# Patient Record
Sex: Female | Born: 1970 | Race: White | Hispanic: No | Marital: Married | Smoking: Current every day smoker
Health system: Southern US, Community
[De-identification: ages and names within clinical notes are randomized; demographics above are authoritative.]

## PROBLEM LIST (undated history)

## (undated) DIAGNOSIS — K219 Gastro-esophageal reflux disease without esophagitis: Secondary | ICD-10-CM

## (undated) DIAGNOSIS — K589 Irritable bowel syndrome without diarrhea: Secondary | ICD-10-CM

## (undated) DIAGNOSIS — E079 Disorder of thyroid, unspecified: Secondary | ICD-10-CM

## (undated) DIAGNOSIS — E785 Hyperlipidemia, unspecified: Secondary | ICD-10-CM

## (undated) DIAGNOSIS — C539 Malignant neoplasm of cervix uteri, unspecified: Secondary | ICD-10-CM

## (undated) HISTORY — DX: Gastro-esophageal reflux disease without esophagitis: K21.9

## (undated) HISTORY — DX: Disorder of thyroid, unspecified: E07.9

## (undated) HISTORY — PX: TONSILLECTOMY: SUR1361

## (undated) HISTORY — PX: CARPAL TUNNEL RELEASE: SHX101

## (undated) HISTORY — DX: Hyperlipidemia, unspecified: E78.5

## (undated) HISTORY — PX: ABDOMINAL HYSTERECTOMY: SHX81

## (undated) HISTORY — DX: Malignant neoplasm of cervix uteri, unspecified: C53.9

## (undated) HISTORY — DX: Irritable bowel syndrome, unspecified: K58.9

---

## 2003-04-22 ENCOUNTER — Other Ambulatory Visit: Admission: RE | Admit: 2003-04-22 | Discharge: 2003-04-22 | Payer: Self-pay | Admitting: Family Medicine

## 2005-03-01 ENCOUNTER — Other Ambulatory Visit: Admission: RE | Admit: 2005-03-01 | Discharge: 2005-03-01 | Payer: Self-pay | Admitting: Family Medicine

## 2012-02-20 HISTORY — PX: ESOPHAGOGASTRODUODENOSCOPY: SHX1529

## 2013-09-16 ENCOUNTER — Other Ambulatory Visit: Payer: Self-pay | Admitting: General Practice

## 2016-03-31 LAB — HEMOGLOBIN A1C: Hemoglobin A1C: 5.4

## 2016-03-31 LAB — TSH: TSH: 8.81 u[IU]/mL — AB (ref 0.41–5.90)

## 2016-04-04 LAB — LIPID PANEL
Cholesterol: 193 mg/dL (ref 0–200)
HDL: 23 mg/dL — AB (ref 35–70)
LDL Cholesterol: 135 mg/dL
Triglycerides: 115 mg/dL (ref 40–160)

## 2016-05-20 ENCOUNTER — Ambulatory Visit (INDEPENDENT_AMBULATORY_CARE_PROVIDER_SITE_OTHER): Payer: Self-pay | Admitting: "Endocrinology

## 2016-05-20 ENCOUNTER — Encounter: Payer: 59 | Admitting: "Endocrinology

## 2016-05-20 ENCOUNTER — Encounter: Payer: Self-pay | Admitting: "Endocrinology

## 2016-05-20 VITALS — BP 130/74 | HR 82 | Ht 62.0 in | Wt 160.0 lb

## 2016-05-20 DIAGNOSIS — E038 Other specified hypothyroidism: Secondary | ICD-10-CM | POA: Insufficient documentation

## 2016-05-20 MED ORDER — LEVOTHYROXINE SODIUM 25 MCG PO TABS: 25.0000 ug | ORAL_TABLET | Freq: Every day | ORAL | Status: DC

## 2016-05-20 NOTE — Progress Notes (Signed)
Subjective:    Patient ID: Brooke Parsons, female    DOB: Apr 11, 1971, PCP Sherrilyn Rist   Past Medical History  Diagnosis Date  . Irritable bowel syndrome (IBS)   . GERD (gastroesophageal reflux disease)   . Hyperlipidemia   . Cervical cancer [facility])    Past Surgical History  Procedure Laterality Date  . Abdominal hysterectomy    . Tonsillectomy    . Carpal tunnel release     Social History   Social History  . Marital Status: Married    Spouse Name: N/A  . Number of Children: N/A  . Years of Education: N/A   Social History Main Topics  . Smoking status: Current Every Day Smoker  . Smokeless tobacco: None  . Alcohol Use: No  . Drug Use: No  . Sexual Activity: Not Asked   Other Topics Concern  . None   Social History Narrative  . None   Outpatient Encounter Prescriptions as of 05/20/2016  Medication Sig  . HYDROcodone-acetaminophen (NORCO/VICODIN) 5-325 MG tablet Take 1 tablet by mouth every 6 (six) hours as needed for moderate pain.  . pantoprazole (PROTONIX) 40 MG tablet Take 40 mg by mouth daily.   No facility-administered encounter medications on file as of 05/20/2016.   ALLERGIES: Allergies  Allergen Reactions  . Clindamycin/Lincomycin   . Morphine And Related   . Synthroid [Levothyroxine] Palpitations    Palpitations, SOB   VACCINATION STATUS:  There is no immunization history on file for this patient.  HPI 45 year old female patient with medical history as above. She is being seen in consultation for hypothyroidism requested by her Candi Leash, PA-C. She was diagnosed with hypothyroidism in May 2016. She was started on levothyroxine 25 g by mouth every morning. She claims that she did not tolerate this medication causing palpitations for her. She has distant relatives with thyroid dysfunction including a cousin. She denies percentile history of goiter, family history of thyroid cancer. -She has dealt with fatigue, weight gain, cold  intolerance, and constipation for approximately 6 months. -She has 3 grown children, none of them diagnosed with thyroid problems. - Her thyroid function tests in May were significant for elevated TSH of 8.8, free T4 low at 0. 77. She also had dyslipidemia including high LDL of 135. Review of Systems  Constitutional: +weight gain, + fatigue, + subjective hypothermia Eyes: no blurry vision, no xerophthalmia ENT: no sore throat, no nodules palpated in throat, no dysphagia/odynophagia, no hoarseness Cardiovascular: no CP/SOB/palpitations/leg swelling Respiratory: no cough/SOB Gastrointestinal: no N/V/D/C Musculoskeletal: + muscle/joint aches Skin: no rashes Neurological: no tremors/numbness/tingling/dizziness Psychiatric: no depression/anxiety   Objective:    BP 130/74 mmHg  Pulse 82  Ht 5\' 2"  (1.575 m)  Wt 160 lb (72.576 kg)  BMI 29.26 kg/m2  SpO2 97%  Wt Readings from Last 3 Encounters:  05/20/16 160 lb (72.576 kg)    Physical Exam  Constitutional: overweight, in NAD Eyes: PERRLA, EOMI, no exophthalmos ENT: moist mucous membranes, no thyromegaly, no cervical lymphadenopathy Cardiovascular: RRR, No MRG Respiratory: CTA B Gastrointestinal: abdomen soft, NT, ND, BS+ Musculoskeletal: no deformities, strength intact in all 4, She just had carpal tunnel surgery on the right hand. Skin: moist, warm, no rashes Neurological: no tremor with outstretched hands, DTR normal in all 4    Assessment & Plan:   Hypothyroidism: -Etiology unclear. I will obtain antithyroid antibodies next visit. I had a long discussion with her regarding choices of replacing thyroid hormone. At this time she does not need  100% replacement. I have advised her to resume Synthroid (brand name) half of 25 g by mouth every morning. This dose needs to be adjusted up for his as she tolerates. -  - We discussed about correct intake of levothyroxine, at fasting, with water, separated by at least 30 minutes from  breakfast, and separated by more than 4 hours from calcium, iron, multivitamins, acid reflux medications (PPIs). -Patient is made aware of the fact that thyroid hormone replacement is needed for life, dose to be adjusted by periodic monitoring of thyroid function tests. -Her next test would be 7 weeks from now with office visit. -I have emphasized the fact that treatment of hypothyroidism will help with the cholesterol clearance as well.  - I advised patient to maintain close follow up with Candi Leash, PA-C for primary care needs. Follow up plan: 8 weeks with labs. Glade Lloyd, MD Phone: [phone]  Fax: [phone]   05/20/2016, 3:30 PM

## 2016-05-20 NOTE — Progress Notes (Signed)
  HPI Review of Systems Objective:   Physical Exam     This is a duplicate entry. Patient was seen and documented on separate entry.

## 2016-06-01 ENCOUNTER — Encounter: Payer: Self-pay | Admitting: "Endocrinology

## 2016-07-18 ENCOUNTER — Ambulatory Visit (INDEPENDENT_AMBULATORY_CARE_PROVIDER_SITE_OTHER): Payer: Self-pay | Admitting: "Endocrinology

## 2016-07-18 ENCOUNTER — Encounter: Payer: Self-pay | Admitting: "Endocrinology

## 2016-07-18 VITALS — BP 114/71 | HR 55 | Ht 62.0 in | Wt 160.0 lb

## 2016-07-18 DIAGNOSIS — E063 Autoimmune thyroiditis: Secondary | ICD-10-CM

## 2016-07-18 DIAGNOSIS — E038 Other specified hypothyroidism: Secondary | ICD-10-CM

## 2016-07-18 MED ORDER — LEVOTHYROXINE SODIUM 25 MCG PO TABS: 25.0000 ug | ORAL_TABLET | Freq: Every day | ORAL | 3 refills | Status: DC

## 2016-07-18 NOTE — Progress Notes (Signed)
Subjective:    Patient ID: Brooke Parsons, female    DOB: [birth year], PCP Sherrilyn Rist   Past Medical History:  Diagnosis Date  . Cervical cancer ([place])   . GERD (gastroesophageal reflux disease)   . Hyperlipidemia   . Irritable bowel syndrome (IBS)    Past Surgical History:  Procedure Laterality Date  . ABDOMINAL HYSTERECTOMY    . CARPAL TUNNEL RELEASE    . TONSILLECTOMY     Social History   Social History  . Marital status: Married    Spouse name: N/A  . Number of children: N/A  . Years of education: N/A   Social History Main Topics  . Smoking status: Current Every Day Smoker  . Smokeless tobacco: Never Used  . Alcohol use No  . Drug use: No  . Sexual activity: Not Asked   Other Topics Concern  . None   Social History Narrative  . None   Outpatient Encounter Prescriptions as of 07/18/2016  Medication Sig  . levothyroxine (SYNTHROID, LEVOTHROID) 25 MCG tablet Take 1 tablet (25 mcg total) by mouth daily before breakfast.  . pantoprazole (PROTONIX) 40 MG tablet Take 40 mg by mouth daily.  . [DISCONTINUED] HYDROcodone-acetaminophen (NORCO/VICODIN) 5-325 MG tablet Take 1 tablet by mouth every 6 (six) hours as needed for moderate pain.  . [DISCONTINUED] levothyroxine (SYNTHROID, LEVOTHROID) 25 MCG tablet Take 1 tablet (25 mcg total) by mouth daily before breakfast.   No facility-administered encounter medications on file as of 07/18/2016.    ALLERGIES: Allergies  Allergen Reactions  . Clindamycin/Lincomycin   . Morphine And Related   . Synthroid [Levothyroxine] Palpitations    Palpitations, SOB   VACCINATION STATUS:  There is no immunization history on file for this patient.  HPI 45 year old female patient with medical history as above. She is being seen in  Follow-up  for hypothyroidism requested by her Candi Leash, PA-C. She was diagnosed with hypothyroidism in May 2016. She was started on levothyroxine 12.5 g by mouth every morning. She  has tolerated at this dose since last visit . She claims that she did not tolerate this medication causing palpitations for her. She has distant relatives with thyroid dysfunction including a cousin. She denies percentile history of goiter, family history of thyroid cancer. -She has dealt with fatigue, weight gain, cold intolerance, and constipation for approximately 6 months. -She has 3 grown children, none of them diagnosed with thyroid problems.  Review of Systems  Constitutional:  Stable weight , + fatigue, + subjective hypothermia Eyes: no blurry vision, no xerophthalmia ENT: no sore throat, no nodules palpated in throat, no dysphagia/odynophagia, no hoarseness Cardiovascular: no CP/SOB/palpitations/leg swelling Respiratory: no cough/SOB Gastrointestinal: no N/V/D/C Musculoskeletal: + muscle/joint aches Skin: no rashes Neurological: no tremors/numbness/tingling/dizziness Psychiatric: no depression/anxiety   Objective:    BP 114/71   Pulse (!) 55   Ht 5\' 2"  (1.575 m)   Wt 160 lb (72.6 kg)   BMI 29.26 kg/m   Wt Readings from Last 3 Encounters:  07/18/16 160 lb (72.6 kg)  05/20/16 160 lb (72.6 kg)    Physical Exam  Constitutional: overweight, in NAD Eyes: PERRLA, EOMI, no exophthalmos ENT: moist mucous membranes, no thyromegaly, no cervical lymphadenopathy Cardiovascular: RRR, No MRG Respiratory: CTA B Gastrointestinal: abdomen soft, NT, ND, BS+ Musculoskeletal: no deformities, strength intact in all 4, She just had carpal tunnel surgery on the right hand. Skin: moist, warm, no rashes Neurological: no tremor with outstretched hands, DTR normal in all 4  Assessment & Plan:   Hypothyroidism: Hashimoto's thyroiditis:  I had a long discussion with her regarding choices of replacing thyroid hormone.  She has Hashimoto's thyroiditis causing hypothyroidism. I have advised her to increase  Synthroid (brand name) to  25 g by mouth every morning. This dose needs to be  adjusted up for his as she tolerates.   - We discussed about correct intake of levothyroxine, at fasting, with water, separated by at least 30 minutes from breakfast, and separated by more than 4 hours from calcium, iron, multivitamins, acid reflux medications (PPIs). -Patient is made aware of the fact that thyroid hormone replacement is needed for life, dose to be adjusted by periodic monitoring of thyroid function tests. -Her next test would be 7 weeks from now with office visit. -I have emphasized the fact that treatment of hypothyroidism will help with the cholesterol clearance as well.  - I advised patient to maintain close follow up with Candi Leash, PA-C for primary care needs. Follow up plan: 8 weeks with labs. Glade Lloyd, MD Phone: [phone]  Fax: [phone]   07/18/2016, 11:06 AM

## 2016-07-26 ENCOUNTER — Other Ambulatory Visit: Payer: Self-pay

## 2016-07-26 MED ORDER — LEVOTHYROXINE SODIUM 25 MCG PO TABS: 25.0000 ug | ORAL_TABLET | Freq: Every day | ORAL | 3 refills | Status: DC

## 2016-10-17 ENCOUNTER — Ambulatory Visit: Payer: Self-pay | Admitting: "Endocrinology

## 2016-12-14 ENCOUNTER — Ambulatory Visit (INDEPENDENT_AMBULATORY_CARE_PROVIDER_SITE_OTHER): Payer: 59 | Admitting: Family Medicine

## 2016-12-14 ENCOUNTER — Encounter: Payer: Self-pay | Admitting: Family Medicine

## 2016-12-14 VITALS — BP 102/73 | HR 74 | Temp 98.2°F | Ht 63.0 in | Wt 168.6 lb

## 2016-12-14 DIAGNOSIS — J0100 Acute maxillary sinusitis, unspecified: Secondary | ICD-10-CM

## 2016-12-14 MED ORDER — CEFDINIR 250 MG/5ML PO SUSR: 250.0000 mg | Freq: Two times a day (BID) | ORAL | 0 refills | Status: DC

## 2016-12-14 NOTE — Progress Notes (Signed)
   Subjective:    Patient ID: Brooke Parsons, female    DOB: 01-21-1971, 46 y.o.   MRN: [id number]  HPI first visit was 46 year old female who has some sinus pain facial edema. She took Augmentin last week  for sore throat but seemed to never get better. She says it feels like her throat is closing and she has some shortness of breath. She has not had fever.  Patient Active Problem List   Diagnosis Date Noted  . Hashimoto's thyroiditis 07/18/2016  . Other specified hypothyroidism 05/20/2016   Outpatient Encounter Prescriptions as of 12/14/2016  Medication Sig  . levothyroxine (SYNTHROID) 50 MCG tablet Take 1 tablet by mouth daily.  . pantoprazole (PROTONIX) 40 MG tablet Take 40 mg by mouth daily.  . sertraline (ZOLOFT) 25 MG tablet Take 25 mg by mouth daily.  . [DISCONTINUED] levothyroxine (SYNTHROID, LEVOTHROID) 25 MCG tablet Take 1 tablet (25 mcg total) by mouth daily before breakfast.   No facility-administered encounter medications on file as of 12/14/2016.       Review of Systems  Constitutional: Negative.   HENT: Positive for congestion, postnasal drip and sinus pressure.   Respiratory: Negative.   Cardiovascular: Negative.   Neurological: Negative.   Psychiatric/Behavioral: Negative.        Objective:   Physical Exam  Constitutional: She is oriented to person, place, and time. She appears well-developed and well-nourished.  HENT:  Mouth/Throat: Oropharynx is clear and moist. No oropharyngeal exudate.  Tenderness to percussion over maxillary sinuses  Cardiovascular: Normal rate and regular rhythm.   Pulmonary/Chest: Effort normal and breath sounds normal.  Neurological: She is alert and oriented to person, place, and time.   BP 102/73   Pulse 74   Temp 98.2 F (36.8 C) (Oral)   Ht 5\' 3"  (1.6 m)   Wt 168 lb 9.6 oz (76.5 kg)   BMI 29.87 kg/m         Assessment & Plan:  1. Acute maxillary sinusitis, recurrence not specified I assume this is sinusitis  although symptoms are not exactly text book. The facial swelling and tenderness is the primary symptom but it would be unusual for sinusitis to develop while taking Augmentin. We talked about general measures to treat such as humidification not showers vaporizers Mucinex D. I will give her a prescription for another antibiotic, Cefdinir, 250 mg per teaspoon. We opted for the liquid since it would be easier for her to swallow  Wardell Honour MD

## 2016-12-27 ENCOUNTER — Ambulatory Visit (INDEPENDENT_AMBULATORY_CARE_PROVIDER_SITE_OTHER): Payer: 59

## 2016-12-27 ENCOUNTER — Encounter: Payer: Self-pay | Admitting: Physician Assistant

## 2016-12-27 ENCOUNTER — Ambulatory Visit (INDEPENDENT_AMBULATORY_CARE_PROVIDER_SITE_OTHER): Payer: 59 | Admitting: Physician Assistant

## 2016-12-27 VITALS — BP 116/75 | HR 73 | Temp 98.9°F | Ht 63.0 in | Wt 169.0 lb

## 2016-12-27 DIAGNOSIS — R059 Cough, unspecified: Secondary | ICD-10-CM

## 2016-12-27 DIAGNOSIS — J0100 Acute maxillary sinusitis, unspecified: Secondary | ICD-10-CM

## 2016-12-27 DIAGNOSIS — J40 Bronchitis, not specified as acute or chronic: Secondary | ICD-10-CM | POA: Diagnosis not present

## 2016-12-27 DIAGNOSIS — R0602 Shortness of breath: Secondary | ICD-10-CM

## 2016-12-27 DIAGNOSIS — R05 Cough: Secondary | ICD-10-CM

## 2016-12-27 MED ORDER — METHYLPREDNISOLONE ACETATE 80 MG/ML IJ SUSP
80.0000 mg | Freq: Once | INTRAMUSCULAR | Status: AC
  Administered 2016-12-27: 80 mg via INTRAMUSCULAR

## 2016-12-27 MED ORDER — DOXYCYCLINE HYCLATE 100 MG PO TABS: 100.0000 mg | ORAL_TABLET | Freq: Two times a day (BID) | ORAL | 0 refills | Status: DC

## 2016-12-27 NOTE — Patient Instructions (Signed)
In a few days you may receive a survey in the mail or online from [employer] regarding your visit with [place] today. Please take a moment to fill this out. Your feedback is very important to our whole office. It can help [place] better understand your needs as well as improve your experience and satisfaction. Thank you for taking your time to complete it. We care about you.  Particia Nearing, PA-C  Acute Bronchitis, Adult Acute bronchitis is sudden (acute) swelling of the air tubes (bronchi) in the lungs. Acute bronchitis causes these tubes to fill with mucus, which can make it hard to breathe. It can also cause coughing or wheezing. In adults, acute bronchitis usually goes away within 2 weeks. A cough caused by bronchitis may last up to 3 weeks. Smoking, allergies, and asthma can make the condition worse. Repeated episodes of bronchitis may cause further lung problems, such as chronic obstructive pulmonary disease (COPD). What are the causes? This condition can be caused by germs and by substances that irritate the lungs, including:  Cold and flu viruses. This condition is most often caused by the same virus that causes a cold.  Bacteria.  Exposure to tobacco smoke, dust, fumes, and air pollution. What increases the risk? This condition is more likely to develop in people who:  Have close contact with someone with acute bronchitis.  Are exposed to lung irritants, such as tobacco smoke, dust, fumes, and vapors.  Have a weak immune system.  Have a respiratory condition such as asthma. What are the signs or symptoms? Symptoms of this condition include:  A cough.  Coughing up clear, yellow, or green mucus.  Wheezing.  Chest congestion.  Shortness of breath.  A fever.  Body aches.  Chills.  A sore throat. How is this diagnosed? This condition is usually diagnosed with a physical exam. During the exam, your health care provider may order tests, such as chest X-rays, to rule out other  conditions. He or she may also:  Test a sample of your mucus for bacterial infection.  Check the level of oxygen in your blood. This is done to check for pneumonia.  Do a chest X-ray or lung function testing to rule out pneumonia and other conditions.  Perform blood tests. Your health care provider will also ask about your symptoms and medical history. How is this treated? Most cases of acute bronchitis clear up over time without treatment. Your health care provider may recommend:  Drinking more fluids. Drinking more makes your mucus thinner, which may make it easier to breathe.  Taking a medicine for a fever or cough.  Taking an antibiotic medicine.  Using an inhaler to help improve shortness of breath and to control a cough.  Using a cool mist vaporizer or humidifier to make it easier to breathe. Follow these instructions at home: Medicines  Take over-the-counter and prescription medicines only as told by your health care provider.  If you were prescribed an antibiotic, take it as told by your health care provider. Do not stop taking the antibiotic even if you start to feel better. General instructions  Get plenty of rest.  Drink enough fluids to keep your urine clear or pale yellow.  Avoid smoking and secondhand smoke. Exposure to cigarette smoke or irritating chemicals will make bronchitis worse. If you smoke and you need help quitting, ask your health care provider. Quitting smoking will help your lungs heal faster.  Use an inhaler, cool mist vaporizer, or humidifier as told by  your health care provider.  Keep all follow-up visits as told by your health care provider. This is important. How is this prevented? To lower your risk of getting this condition again:  Wash your hands often with soap and water. If soap and water are not available, use hand sanitizer.  Avoid contact with people who have cold symptoms.  Try not to touch your hands to your mouth, nose, or  eyes.  Make sure to get the flu shot every year. Contact a health care provider if:  Your symptoms do not improve in 2 weeks of treatment. Get help right away if:  You cough up blood.  You have chest pain.  You have severe shortness of breath.  You become dehydrated.  You faint or keep feeling like you are going to faint.  You keep vomiting.  You have a severe headache.  Your fever or chills gets worse. This information is not intended to replace advice given to you by your health care provider. Make sure you discuss any questions you have with your health care provider. Document Released: 12/15/2004 Document Revised: 06/01/2016 Document Reviewed: 04/27/2016 Elsevier Interactive Patient Education  2017 [employer].

## 2016-12-27 NOTE — Progress Notes (Signed)
BP 116/75   Pulse 73   Temp 98.9 F (37.2 C) (Oral)   Ht 5\' 3"  (1.6 m)   Wt 169 lb (76.7 kg)   BMI 29.94 kg/m    Subjective:    Patient ID: Brooke Parsons, female    DOB: 13-Aug-1971, 46 y.o.   MRN: [id number]  HPI: Brooke Parsons is a 46 y.o. female presenting on 12/27/2016 for Cough; Nasal Congestion; Sore Throat; backache; Stuffy Head; and Shortness of Breath  This patient has had many days of sinus headache and postnasal drainage. There is copious drainage at times. Denies any fever at this time. There has been a history of sinus infections in the past.  No history of sinus surgery. There is cough at night. It has become more prevalent in recent days.  Relevant past medical, surgical, family and social history reviewed and updated as indicated. Allergies and medications reviewed and updated.  Past Medical History:  Diagnosis Date  . Cervical cancer ([place])   . GERD (gastroesophageal reflux disease)   . Hyperlipidemia   . Irritable bowel syndrome (IBS)   . Thyroid disease     Past Surgical History:  Procedure Laterality Date  . ABDOMINAL HYSTERECTOMY    . CARPAL TUNNEL RELEASE    . TONSILLECTOMY      Review of Systems  Constitutional: Positive for chills and fatigue. Negative for activity change and appetite change.  HENT: Positive for congestion, postnasal drip, sinus pain, sinus pressure and sore throat.   Eyes: Negative.   Respiratory: Positive for cough, shortness of breath and wheezing.   Cardiovascular: Negative.  Negative for chest pain, palpitations and leg swelling.  Gastrointestinal: Negative.   Genitourinary: Negative.   Musculoskeletal: Negative.   Skin: Negative.   Neurological: Positive for headaches.    Allergies as of 12/27/2016      Reactions   Clindamycin/lincomycin    Morphine And Related    Synthroid [levothyroxine] Palpitations   Can not take the Levothyroxine Palpitations, SOB      Medication List       Accurate as of 12/27/16  2:15  PM. Always use your most recent med list.          doxycycline 100 MG tablet Commonly known as:  VIBRA-TABS Take 1 tablet (100 mg total) by mouth 2 (two) times daily.   pantoprazole 40 MG tablet Commonly known as:  PROTONIX Take 40 mg by mouth daily.   sertraline 25 MG tablet Commonly known as:  ZOLOFT Take 25 mg by mouth daily.   SYNTHROID 75 MCG tablet Generic drug:  levothyroxine Take by mouth.          Objective:    BP 116/75   Pulse 73   Temp 98.9 F (37.2 C) (Oral)   Ht 5\' 3"  (1.6 m)   Wt 169 lb (76.7 kg)   BMI 29.94 kg/m   Allergies  Allergen Reactions  . Clindamycin/Lincomycin   . Morphine And Related   . Synthroid [Levothyroxine] Palpitations    Can not take the Levothyroxine Palpitations, SOB    Physical Exam  Constitutional: She is oriented to person, place, and time. She appears well-developed and well-nourished.  HENT:  Head: Normocephalic and atraumatic.  Right Ear: There is drainage and tenderness.  Left Ear: There is drainage and tenderness.  Nose: Mucosal edema and rhinorrhea present. Right sinus exhibits maxillary sinus tenderness and frontal sinus tenderness. Left sinus exhibits maxillary sinus tenderness and frontal sinus tenderness.  Mouth/Throat: Oropharyngeal exudate and  posterior oropharyngeal erythema present.  Eyes: Conjunctivae and EOM are normal. Pupils are equal, round, and reactive to light.  Neck: Normal range of motion. Neck supple.  Cardiovascular: Normal rate, regular rhythm, normal heart sounds and intact distal pulses.   Pulmonary/Chest: Effort normal. She has wheezes in the right upper field and the left upper field.  Abdominal: Soft. Bowel sounds are normal.  Neurological: She is alert and oriented to person, place, and time. She has normal reflexes.  Skin: Skin is warm and dry. No rash noted.  Psychiatric: She has a normal mood and affect. Her behavior is normal. Judgment and thought content normal.          Assessment & Plan:   1. SOB (shortness of breath) - DG Chest 2 View; Future  2. Cough - DG Chest 2 View; Future  3. Acute non-recurrent maxillary sinusitis - doxycycline (VIBRA-TABS) 100 MG tablet; Take 1 tablet (100 mg total) by mouth 2 (two) times daily.  Dispense: 20 tablet; Refill: 0 - methylPREDNISolone acetate (DEPO-MEDROL) injection 80 mg; Inject 1 mL (80 mg total) into the muscle once.  4. Bronchitis - doxycycline (VIBRA-TABS) 100 MG tablet; Take 1 tablet (100 mg total) by mouth 2 (two) times daily.  Dispense: 20 tablet; Refill: 0 - methylPREDNISolone acetate (DEPO-MEDROL) injection 80 mg; Inject 1 mL (80 mg total) into the muscle once.   Continue all other maintenance medications as listed above.  Follow up plan: Return if symptoms worsen or fail to improve.  Orders Placed This Encounter  Procedures  . DG Chest 2 View    Educational handout given for bronchitis  Terald Sleeper PA-C [facility] [address]  [place], [place] [address] [phone]   12/27/2016, 2:15 PM

## 2017-03-30 ENCOUNTER — Ambulatory Visit: Payer: 59 | Admitting: Family

## 2017-03-31 ENCOUNTER — Encounter: Payer: Self-pay | Admitting: Family

## 2017-03-31 ENCOUNTER — Ambulatory Visit (INDEPENDENT_AMBULATORY_CARE_PROVIDER_SITE_OTHER): Payer: 59 | Admitting: Family

## 2017-03-31 VITALS — BP 110/77 | HR 85 | Temp 99.2°F | Ht 63.0 in | Wt 174.8 lb

## 2017-03-31 DIAGNOSIS — R350 Frequency of micturition: Secondary | ICD-10-CM | POA: Diagnosis not present

## 2017-03-31 DIAGNOSIS — E785 Hyperlipidemia, unspecified: Secondary | ICD-10-CM

## 2017-03-31 DIAGNOSIS — K219 Gastro-esophageal reflux disease without esophagitis: Secondary | ICD-10-CM

## 2017-03-31 DIAGNOSIS — R509 Fever, unspecified: Secondary | ICD-10-CM

## 2017-03-31 LAB — URINALYSIS
Bilirubin, UA: NEGATIVE
Glucose, UA: NEGATIVE
Ketones, UA: NEGATIVE
Leukocytes, UA: NEGATIVE
Nitrite, UA: NEGATIVE
Protein, UA: NEGATIVE
Specific Gravity, UA: 1.01 (ref 1.005–1.030)
Urobilinogen, Ur: 0.2 mg/dL (ref 0.2–1.0)
pH, UA: 5.5 (ref 5.0–7.5)

## 2017-03-31 MED ORDER — DEXLANSOPRAZOLE 60 MG PO CPDR: 60.0000 mg | DELAYED_RELEASE_CAPSULE | Freq: Every day | ORAL | 1 refills | Status: DC

## 2017-03-31 NOTE — Patient Instructions (Signed)
Gastroesophageal Reflux Scan A gastroesophageal reflux scan is a procedure that is used to check for gastroesophageal reflux, which is the backward flow of stomach contents into the tube that carries food from the mouth to the stomach (esophagus). The scan can also show if any stomach contents are inhaled (aspirated) into your lungs. You may need this scan if you have symptoms such as heartburn, vomiting, swallowing problems, or regurgitation. Regurgitation means that swallowed food is returning from the stomach to the esophagus. For this scan, you will drink a liquid that contains a small amount of a radioactive substance (tracer). A scanner with a camera that detects the radioactive tracer is used to see if any of the material backs up into your esophagus. Tell a health care provider about:  Any allergies you have.  All medicines you are taking, including vitamins, herbs, eye drops, creams, and over-the-counter medicines.  Any blood disorders you have.  Any surgeries you have had.  Any medical conditions you have.  If you are pregnant or you think that you may be pregnant.  If you are breastfeeding. What are the risks? Generally, this is a safe procedure. However, problems may occur, including:  Exposure to radiation (a small amount).  Allergic reaction to the radioactive substance. This is rare. What happens before the procedure?  Ask your health care provider about changing or stopping your regular medicines. This is especially important if you are taking diabetes medicines or blood thinners.  Follow your health care provider's instructions about eating or drinking restrictions. What happens during the procedure?  You will be asked to drink a liquid that contains a small amount of a radioactive tracer. This liquid will probably be similar to orange juice.  You will assume a position lying on your back.  A series of images will be taken of your esophagus and upper  stomach.  You may be asked to move into different positions to help determine if reflux occurs more often when you are in specific positions.  For adults, an abdominal binder with an inflatable cuff may be placed on the belly (abdomen). This may be used to increase abdominal pressure. More images will be taken to see if the increased pressure causes reflux to occur. The procedure may vary among health care providers and hospitals. What happens after the procedure?  Return to your normal activities and your normal diet as directed by your health care provider.  The radioactive tracer will leave your body over the next few days. Drink enough fluid to keep your urine clear or pale yellow. This will help to flush the tracer out of your body.  It is your responsibility to obtain your test results. Ask your health care provider or the department performing the test when and how you will get your results. This information is not intended to replace advice given to you by your health care provider. Make sure you discuss any questions you have with your health care provider. Document Released: 12/29/2005 Document Revised: 08/01/2016 Document Reviewed: 08/19/2014 Elsevier Interactive Patient Education  2017 Elsevier Inc.  

## 2017-03-31 NOTE — Progress Notes (Signed)
Subjective:    Patient ID: Brooke Parsons, female    DOB: May 31, 1971, 46 y.o.   MRN: [id number]  Pt presents to the office today with recurrent GERD and starer her Protonix is not working.  Cough  Associated symptoms include heartburn.  Gastroesophageal Reflux  She complains of belching, choking, coughing, dysphagia, globus sensation and heartburn. She reports no nausea. This is a chronic problem. The current episode started more than 1 year ago. The problem has been rapidly worsening. The heartburn is located in the substernum. The heartburn is of moderate intensity. The heartburn wakes her from sleep. The heartburn limits her activity. The heartburn changes with position. The symptoms are aggravated by certain foods and lying down. Risk factors include obesity. She has tried a PPI for the symptoms. The treatment provided mild relief.  Hyperlipidemia  This is a chronic problem. The current episode started more than 1 year ago. The problem is uncontrolled. Recent lipid tests were reviewed and are high. Exacerbating diseases include obesity. Current antihyperlipidemic treatment includes diet change. The current treatment provides no improvement of lipids. Risk factors for coronary artery disease include dyslipidemia, post-menopausal and family history.  Urinary Frequency   This is a new problem. The current episode started 1 to 4 weeks ago. The problem occurs intermittently. The problem has been waxing and waning. The pain is at a severity of 0/10. Associated symptoms include flank pain, frequency and urgency. Pertinent negatives include no hematuria, hesitancy or nausea. She has tried increased fluids for the symptoms. The treatment provided no relief.  Back Pain  This is a new problem. The current episode started 1 to 4 weeks ago. The problem occurs intermittently. The problem has been waxing and waning since onset. The pain is present in the lumbar spine. The pain is worse during the night.  Associated symptoms include bladder incontinence. Pertinent negatives include no bowel incontinence.      Review of Systems  Respiratory: Positive for cough and choking.   Gastrointestinal: Positive for dysphagia and heartburn. Negative for bowel incontinence and nausea.  Genitourinary: Positive for bladder incontinence, flank pain, frequency and urgency. Negative for hematuria and hesitancy.  Musculoskeletal: Positive for back pain.  All other systems reviewed and are negative.      Objective:   Physical Exam  Constitutional: She is oriented to person, place, and time. She appears well-developed and well-nourished. No distress.  HENT:  Head: Normocephalic and atraumatic.  Right Ear: External ear normal.  Left Ear: External ear normal.  Nose: Nose normal.  Mouth/Throat: Oropharynx is clear and moist.  Eyes: Pupils are equal, round, and reactive to light.  Neck: Normal range of motion. Neck supple. No thyromegaly present.  Cardiovascular: Normal rate, regular rhythm, normal heart sounds and intact distal pulses.   No murmur heard. Pulmonary/Chest: Effort normal and breath sounds normal. No respiratory distress. She has no wheezes.  Abdominal: Soft. Bowel sounds are normal. She exhibits no distension. There is no tenderness.  Musculoskeletal: Normal range of motion. She exhibits no edema or tenderness.  Neurological: She is alert and oriented to person, place, and time.  Skin: Skin is warm and dry.  Psychiatric: She has a normal mood and affect. Her behavior is normal. Judgment and thought content normal.  Vitals reviewed.   BP 110/77   Pulse 85   Temp 99.2 F (37.3 C) (Oral)   Ht 5' 3"  (1.6 m)   Wt 174 lb 12.8 oz (79.3 kg)   BMI 30.96 kg/m  Assessment & Plan:  1. GERD without esophagitis -Diet discussed- Avoid fried, spicy, citrus foods, caffeine and alcohol -Do not eat 2-3 hours before bedtime -Encouraged small frequent meals -Avoid NSAID's -  dexlansoprazole (DEXILANT) 60 MG capsule; Take 1 capsule (60 mg total) by mouth daily.  Dispense: 90 capsule; Refill: 1  2. Urinary frequency Force fluids AZO over the counter X2 days RTO prn Culture pending - Urinalysis - Urine culture  3. Hyperlipidemia, unspecified hyperlipidemia type - CMP14+EGFR - Lipid panel   4. Fever, unspecified fever cause  - CBC with Differential/Platelet  -If fever worsen or develops abd pain go to ED  Evelina Dun, FNP

## 2017-04-01 LAB — CMP14+EGFR
ALT: 23 IU/L (ref 0–32)
AST: 16 IU/L (ref 0–40)
Albumin/Globulin Ratio: 1.5 (ref 1.2–2.2)
Albumin: 4.7 g/dL (ref 3.5–5.5)
Alkaline Phosphatase: 71 IU/L (ref 39–117)
BUN/Creatinine Ratio: 11 (ref 9–23)
BUN: 8 mg/dL (ref 6–24)
Bilirubin Total: 0.3 mg/dL (ref 0.0–1.2)
CO2: 25 mmol/L (ref 18–29)
Calcium: 9.7 mg/dL (ref 8.7–10.2)
Chloride: 99 mmol/L (ref 96–106)
Creatinine, Ser: 0.7 mg/dL (ref 0.57–1.00)
GFR calc Af Amer: 122 mL/min/{1.73_m2} (ref >59)
GFR calc non Af Amer: 106 mL/min/{1.73_m2} (ref >59)
Globulin, Total: 3.1 g/dL (ref 1.5–4.5)
Glucose: 88 mg/dL (ref 65–99)
Potassium: 3.7 mmol/L (ref 3.5–5.2)
Sodium: 138 mmol/L (ref 134–144)
Total Protein: 7.8 g/dL (ref 6.0–8.5)

## 2017-04-01 LAB — CBC WITH DIFFERENTIAL/PLATELET
Basophils Absolute: 0 10*3/uL (ref 0.0–0.2)
Basos: 1 %
EOS (ABSOLUTE): 0.1 10*3/uL (ref 0.0–0.4)
Eos: 1 %
Hematocrit: 40.7 % (ref 34.0–46.6)
Hemoglobin: 13.4 g/dL (ref 11.1–15.9)
Immature Grans (Abs): 0 10*3/uL (ref 0.0–0.1)
Immature Granulocytes: 0 %
Lymphocytes Absolute: 3.6 10*3/uL — ABNORMAL HIGH (ref 0.7–3.1)
Lymphs: 46 %
MCH: 31.5 pg (ref 26.6–33.0)
MCHC: 32.9 g/dL (ref 31.5–35.7)
MCV: 96 fL (ref 79–97)
Monocytes Absolute: 0.3 10*3/uL (ref 0.1–0.9)
Monocytes: 4 %
Neutrophils Absolute: 3.8 10*3/uL (ref 1.4–7.0)
Neutrophils: 48 %
Platelets: 249 10*3/uL (ref 150–379)
RBC: 4.25 x10E6/uL (ref 3.77–5.28)
RDW: 13.1 % (ref 12.3–15.4)
WBC: 7.9 10*3/uL (ref 3.4–10.8)

## 2017-04-01 LAB — LIPID PANEL
Chol/HDL Ratio: 6.1 ratio — ABNORMAL HIGH (ref 0.0–4.4)
Cholesterol, Total: 243 mg/dL — ABNORMAL HIGH (ref 100–199)
HDL: 40 mg/dL (ref >39)
LDL Calculated: 156 mg/dL — ABNORMAL HIGH (ref 0–99)
Triglycerides: 234 mg/dL — ABNORMAL HIGH (ref 0–149)
VLDL Cholesterol Cal: 47 mg/dL — ABNORMAL HIGH (ref 5–40)

## 2017-04-03 ENCOUNTER — Other Ambulatory Visit: Payer: Self-pay | Admitting: Family

## 2017-04-03 DIAGNOSIS — E785 Hyperlipidemia, unspecified: Secondary | ICD-10-CM | POA: Insufficient documentation

## 2017-04-03 MED ORDER — SULFAMETHOXAZOLE-TRIMETHOPRIM 800-160 MG PO TABS: 1.0000 | ORAL_TABLET | Freq: Two times a day (BID) | ORAL | 0 refills | Status: DC

## 2017-04-03 MED ORDER — ATORVASTATIN CALCIUM 20 MG PO TABS: 20.0000 mg | ORAL_TABLET | Freq: Every day | ORAL | 3 refills | Status: DC

## 2017-04-04 LAB — URINE CULTURE

## 2017-04-13 ENCOUNTER — Telehealth: Payer: Self-pay | Admitting: Family

## 2017-04-13 DIAGNOSIS — K219 Gastro-esophageal reflux disease without esophagitis: Secondary | ICD-10-CM

## 2017-04-13 NOTE — Telephone Encounter (Signed)
What type of referral do you need? gastro  Have you been seen at our office for this problem? Yes. Discussed at last visit. (If no, schedule them an appointment.  They will need to be seen before a referral can be done.)  Is there a particular doctor or location that you prefer? [place]. Dr [place].   Patient notified that referrals can take up to a week or longer to process. If they haven't heard anything within a week they should call back and speak with the referral department.

## 2017-04-14 NOTE — Telephone Encounter (Signed)
Referral placed.

## 2017-04-14 NOTE — Telephone Encounter (Signed)
Make referral to Dr Oneida Alar for GERD uncontrolled

## 2017-04-19 ENCOUNTER — Encounter: Payer: Self-pay | Admitting: Gastroenterology

## 2017-05-23 ENCOUNTER — Encounter: Payer: Self-pay | Admitting: Gastroenterology

## 2017-05-23 ENCOUNTER — Telehealth: Payer: Self-pay

## 2017-05-23 ENCOUNTER — Ambulatory Visit (INDEPENDENT_AMBULATORY_CARE_PROVIDER_SITE_OTHER): Payer: 59 | Admitting: Gastroenterology

## 2017-05-23 ENCOUNTER — Other Ambulatory Visit: Payer: Self-pay

## 2017-05-23 VITALS — BP 119/80 | HR 60 | Temp 98.4°F | Ht 63.0 in | Wt 176.4 lb

## 2017-05-23 DIAGNOSIS — K219 Gastro-esophageal reflux disease without esophagitis: Secondary | ICD-10-CM

## 2017-05-23 DIAGNOSIS — R1013 Epigastric pain: Secondary | ICD-10-CM

## 2017-05-23 DIAGNOSIS — R112 Nausea with vomiting, unspecified: Secondary | ICD-10-CM

## 2017-05-23 LAB — COMPREHENSIVE METABOLIC PANEL
ALT: 36 U/L — ABNORMAL HIGH (ref 6–29)
AST: 20 U/L (ref 10–30)
Albumin: 4.1 g/dL (ref 3.6–5.1)
Alkaline Phosphatase: 82 U/L (ref 33–115)
BUN: 4 mg/dL — ABNORMAL LOW (ref 7–25)
CO2: 24 mmol/L (ref 20–31)
Calcium: 9.2 mg/dL (ref 8.6–10.2)
Chloride: 105 mmol/L (ref 98–110)
Creat: 0.7 mg/dL (ref 0.50–1.10)
Glucose, Bld: 77 mg/dL (ref 65–99)
Potassium: 4.1 mmol/L (ref 3.5–5.3)
Sodium: 139 mmol/L (ref 135–146)
Total Bilirubin: 0.4 mg/dL (ref 0.2–1.2)
Total Protein: 7 g/dL (ref 6.1–8.1)

## 2017-05-23 MED ORDER — SUCRALFATE 1 GM/10ML PO SUSP: 1.0000 g | Freq: Three times a day (TID) | ORAL | 0 refills | Status: DC

## 2017-05-23 NOTE — Patient Instructions (Signed)
1. Please have your labs and ultrasound done. We will contact you with results as available. 2. Continue Dexilant once daily 30 minutes before breakfast. Add Carafate short term up to four times daily for burning/reflux. 3. Upper endoscopy as scheduled. See separate instructions. If ultrasound is positive, then we will cancel endoscopy.

## 2017-05-23 NOTE — Telephone Encounter (Signed)
PA info for EGD/+/-ED submitted via [facility] website. Case approved. PA ref# [id number].

## 2017-05-23 NOTE — Progress Notes (Signed)
Primary Care Physician:  Terald Sleeper, PA-C  Primary Gastroenterologist:  Barney Drain, MD   Chief Complaint  Patient presents with  . Gastroesophageal Reflux    coughing, indigestion, feels like a lump in her throat, throat burns  . Bloated    pt has IBS    HPI:  Brooke Parsons is a 46 y.o. female here For further evaluation of gastroesophageal reflux disease. Patient was previously evaluated by Dr. Britta Mccreedy but had not been seen in about 5 years. She was unaware that he no longer practiced in [place].   Patient states she was on Protonix for years. In December she had the flu and has had horrible time since then. She complains of frequent regurgitation, coughing, burning in her ears. She feels a fullness in her throat. It took nearly 2 months for her to get over the flu. She developed sinusitis was treated with Bactrim as well. Ever since that time she has had frequent vomiting and/or regurgitation. She complains of diffuse upper abdominal soreness which she believes is related to cough. Sometimes associated with meals. About 2 months ago she was switched to Dexilant 60 mg daily. No noted improvement of her symptoms. She was diagnosed with hypothyroidism last year. She has been continuously gaining weight. She is up 15 pounds in the last 1 year. She gives a history of irritable bowel syndrome. Previously failed Levsin. Did not tolerate amitriptyline due to drowsiness. Workup included upper GI with small bowel follow-through showing rapid small bowel transit with contrast seen in the colon after 30 minutes. Normal sedimentation rate. Weakly positive TTG IgG (patient value of 9, weak +6-9 and positive greater than 9). Antigliadin IgA and IgG, TTG IgA, endomesial antibody IgA, and IgA levels normal. Bowels loose most of the time. Couple of times daily. No blood in the stool or melena. No prior colonoscopy.  She had an upper endoscopy April 2013 with a Schatzki ring, hiatal hernia. Multiple biopsies  from the duodenum were normal, not consistent with celiac sprue.  Current Outpatient Prescriptions  Medication Sig Dispense Refill  . atorvastatin (LIPITOR) 20 MG tablet Take 1 tablet (20 mg total) by mouth daily. 90 tablet 3  . dexlansoprazole (DEXILANT) 60 MG capsule Take 1 capsule (60 mg total) by mouth daily. 90 capsule 1  . levothyroxine (SYNTHROID) 75 MCG tablet Take by mouth.    . sertraline (ZOLOFT) 25 MG tablet Take 25 mg by mouth daily.     No current facility-administered medications for this visit.     Allergies as of 05/23/2017 - Review Complete 05/23/2017  Allergen Reaction Noted  . Clindamycin/lincomycin  05/20/2016  . Morphine and related  05/20/2016  . Synthroid [levothyroxine] Palpitations 05/20/2016    Past Medical History:  Diagnosis Date  . Cervical cancer ([place])   . GERD (gastroesophageal reflux disease)   . Hyperlipidemia   . Irritable bowel syndrome (IBS)   . Thyroid disease     Past Surgical History:  Procedure Laterality Date  . ABDOMINAL HYSTERECTOMY    . CARPAL TUNNEL RELEASE    . ESOPHAGOGASTRODUODENOSCOPY  02/2012   Dr. Britta Mccreedy: schatzki's ring, hh, duodenal bx negative for celiac.   . TONSILLECTOMY      Family History  Problem Relation Age of Onset  . Heart attack Mother   . Stroke Mother   . Heart attack Sister   . Heart attack Brother 47  . Heart attack Maternal Uncle   . Heart attack Maternal Grandfather   . Cancer Maternal Grandfather  throat  . Heart attack Father   . Cancer Paternal Grandfather   . Colon cancer Paternal Uncle     Social History   Social History  . Marital status: Married    Spouse name: N/A  . Number of children: N/A  . Years of education: N/A   Occupational History  . Not on file.   Social History Main Topics  . Smoking status: Former Smoker    Quit date: 12/14/2014  . Smokeless tobacco: Never Used  . Alcohol use No  . Drug use: No  . Sexual activity: Not on file   Other Topics Concern  .  Not on file   Social History Narrative  . No narrative on file      ROS:  General: Negative for anorexia, weight loss, fever, chills, fatigue, weakness. Eyes: Negative for vision changes.  ENT: Negative for hoarseness, difficulty swallowing , nasal congestion. CV: Negative for chest pain, angina, palpitations, dyspnea on exertion, peripheral edema.  Respiratory: Negative for dyspnea at rest, dyspnea on exertion, cough, sputum, wheezing.  GI: See history of present illness. GU:  Negative for dysuria, hematuria, urinary incontinence, urinary frequency, nocturnal urination.  MS: Negative for joint pain, low back pain.  Derm: Negative for rash or itching.  Neuro: Negative for weakness, abnormal sensation, seizure, frequent headaches, memory loss, confusion.  Psych: Negative for anxiety, depression, suicidal ideation, hallucinations.  Endo: Negative for unusual weight change.  Heme: Negative for bruising or bleeding. Allergy: Negative for rash or hives.    Physical Examination:  BP 119/80   Pulse 60   Temp 98.4 F (36.9 C) (Oral)   Ht 5\' 3"  (1.6 m)   Wt 176 lb 6.4 oz (80 kg)   BMI 31.25 kg/m    General: Well-nourished, well-developed in no acute distress.  Head: Normocephalic, atraumatic.   Eyes: Conjunctiva pink, no icterus. Mouth: Oropharyngeal mucosa moist and pink , no lesions erythema or exudate. Neck: Supple without thyromegaly, masses, or lymphadenopathy. When palpating the throat area she gags. Feels full but no obvious mass or lymphadenopathy. Lungs: Clear to auscultation bilaterally.  Heart: Regular rate and rhythm, no murmurs rubs or gallops.  Abdomen: Bowel sounds are normal, nontender, nondistended, no hepatosplenomegaly or masses, no abdominal bruits or    hernia , no rebound or guarding.   Rectal: Not performed Extremities: No lower extremity edema. No clubbing or deformities.  Neuro: Alert and oriented x 4 , grossly normal neurologically.  Skin: Warm and dry,  no rash or jaundice.   Psych: Alert and cooperative, normal mood and affect.  Labs: Lab Results  Component Value Date   CREATININE 0.70 03/31/2017   BUN 8 03/31/2017   NA 138 03/31/2017   K 3.7 03/31/2017   CL 99 03/31/2017   CO2 25 03/31/2017   Lab Results  Component Value Date   WBC 7.9 03/31/2017   HGB 13.4 03/31/2017   HCT 40.7 03/31/2017   MCV 96 03/31/2017   PLT 249 03/31/2017   Lab Results  Component Value Date   ALT 23 03/31/2017   AST 16 03/31/2017   ALKPHOS 71 03/31/2017   BILITOT 0.3 03/31/2017      Imaging Studies: No results found.

## 2017-05-23 NOTE — Telephone Encounter (Signed)
US abdomen scheduled for 05/26/17 at 8:30am, pt to arrive at 8:15am. NPO after midnight. Called and informed pt of [place] appt. Also scheduled EGD/+/-ED with [facility] for 06/22/17 at 8:30am. Instructions for procedure mailed to pt. Orders entered.

## 2017-05-24 LAB — LIPASE: Lipase: 22 U/L (ref 7–60)

## 2017-05-25 ENCOUNTER — Encounter: Payer: Self-pay | Admitting: Gastroenterology

## 2017-05-25 NOTE — Assessment & Plan Note (Signed)
46 year old female with history of GERD and IBS who presents with 7 month history of refractory reflux type symptoms. Complains of frequent regurgitation postprandially. Often associated with nausea. She describes food spontaneously coming up at times without heaving. Complains of cough, burning in the years and told is related to her bad reflux. She feels a fullness in her neck and with palpation of her neck she gags. Mild dysphagia. Progressive weight gain in the setting of hypothyroidism diagnosed one year.  Suspect symptoms are reflux related. Unclear why she is poorly controlled over the past 6 months unless she has post viral gastroparesis and/or related to her hypothyroidism. Cannot exclude biliary origin therefore will labs and ultrasound for further evaluation. Unless obvious gallbladder disease we will plan on EGD+/-ED in near future.  I have discussed the risks, alternatives, benefits with regards to but not limited to the risk of reaction to medication, bleeding, infection, perforation and the patient is agreeable to proceed. Written consent to be obtained.

## 2017-05-26 ENCOUNTER — Ambulatory Visit (HOSPITAL_COMMUNITY)
Admission: RE | Admit: 2017-05-26 | Discharge: 2017-05-26 | Disposition: A | Discharge status: Home / Self Care | Payer: 59 | Source: Ambulatory Visit | Attending: Gastroenterology | Admitting: Gastroenterology

## 2017-05-26 DIAGNOSIS — N2889 Other specified disorders of kidney and ureter: Secondary | ICD-10-CM | POA: Diagnosis not present

## 2017-05-26 DIAGNOSIS — R112 Nausea with vomiting, unspecified: Secondary | ICD-10-CM | POA: Diagnosis not present

## 2017-05-26 DIAGNOSIS — R1013 Epigastric pain: Secondary | ICD-10-CM | POA: Insufficient documentation

## 2017-05-28 NOTE — Progress Notes (Addendum)
REVIEWED. PT NEEDS TO AVOID REFLUX TRIGGERS.TAKE PPI. LOSE 20 LBS.

## 2017-05-29 NOTE — Progress Notes (Signed)
cc'ed to pcp °

## 2017-05-31 NOTE — Progress Notes (Signed)
Please let patient know that her ALT is minimally elevated but would recommend limited work up including Hep B surface Antigen and Hep C ab, iron/tibc, ferritin if she is agreeable.   See u/s result note.

## 2017-05-31 NOTE — Progress Notes (Signed)
Nothing to explain symptoms. Liver normal appearing. Await egd.

## 2017-06-01 ENCOUNTER — Other Ambulatory Visit: Payer: Self-pay

## 2017-06-01 DIAGNOSIS — R740 Nonspecific elevation of levels of transaminase and lactic acid dehydrogenase [LDH]: Principal | ICD-10-CM

## 2017-06-01 DIAGNOSIS — R7401 Elevation of levels of liver transaminase levels: Secondary | ICD-10-CM

## 2017-06-01 NOTE — Progress Notes (Signed)
Pt is aware of lab and [place] results. OK to do the work-up. Lab orders entered and pt will go to [facility] at her convenience to do them.

## 2017-06-01 NOTE — Progress Notes (Signed)
Pt is aware.  

## 2017-06-02 ENCOUNTER — Other Ambulatory Visit (HOSPITAL_COMMUNITY)
Admission: RE | Admit: 2017-06-02 | Discharge: 2017-06-02 | Disposition: A | Discharge status: Home / Self Care | Payer: 59 | Source: Ambulatory Visit | Attending: Gastroenterology | Admitting: Gastroenterology

## 2017-06-02 DIAGNOSIS — R740 Nonspecific elevation of levels of transaminase and lactic acid dehydrogenase [LDH]: Secondary | ICD-10-CM | POA: Insufficient documentation

## 2017-06-02 DIAGNOSIS — R7401 Elevation of levels of liver transaminase levels: Secondary | ICD-10-CM

## 2017-06-02 LAB — IRON AND TIBC
Iron: 97 ug/dL (ref 28–170)
Saturation Ratios: 34 % — ABNORMAL HIGH (ref 10.4–31.8)
TIBC: 284 ug/dL (ref 250–450)
UIBC: 187 ug/dL

## 2017-06-02 LAB — FERRITIN: Ferritin: 113 ng/mL (ref 11–307)

## 2017-06-02 NOTE — Progress Notes (Signed)
Please let patient know slf advises continuing [place]. Encourage 20 pound weight loss. Please send patient antireflux/GERD instructions. EGD is planned.

## 2017-06-03 LAB — HEPATITIS B SURFACE ANTIGEN: Hepatitis B Surface Ag: NEGATIVE

## 2017-06-03 LAB — HEPATITIS C ANTIBODY: HCV Ab: <0.1 s/co ratio (ref 0.0–0.9)

## 2017-06-05 NOTE — Progress Notes (Signed)
PT is aware and GERD instructions in the mail.

## 2017-06-08 NOTE — Progress Notes (Signed)
Please let pt know her hep b and c are negative. Minimally elevated fe sat, likely insignificant.  Minimally elevated ALT possibly due to statin.  Plan as previously outlined.  Would advise repeat LFTS in one year.

## 2017-06-13 ENCOUNTER — Other Ambulatory Visit: Payer: Self-pay | Admitting: Gastroenterology

## 2017-06-13 DIAGNOSIS — R748 Abnormal levels of other serum enzymes: Secondary | ICD-10-CM

## 2017-06-22 ENCOUNTER — Encounter (HOSPITAL_COMMUNITY): Payer: Self-pay

## 2017-06-22 ENCOUNTER — Ambulatory Visit (HOSPITAL_COMMUNITY)
Admission: RE | Admit: 2017-06-22 | Discharge: 2017-06-22 | Disposition: A | Discharge status: Home / Self Care | Payer: 59 | Source: Ambulatory Visit | Attending: Gastroenterology | Admitting: Gastroenterology

## 2017-06-22 ENCOUNTER — Encounter (HOSPITAL_COMMUNITY)
Admission: RE | Disposition: A | Discharge status: Home / Self Care | Payer: Self-pay | Source: Ambulatory Visit | Attending: Gastroenterology

## 2017-06-22 DIAGNOSIS — Z8541 Personal history of malignant neoplasm of cervix uteri: Secondary | ICD-10-CM | POA: Diagnosis not present

## 2017-06-22 DIAGNOSIS — R1319 Other dysphagia: Secondary | ICD-10-CM

## 2017-06-22 DIAGNOSIS — K2970 Gastritis, unspecified, without bleeding: Secondary | ICD-10-CM

## 2017-06-22 DIAGNOSIS — R1310 Dysphagia, unspecified: Secondary | ICD-10-CM | POA: Insufficient documentation

## 2017-06-22 DIAGNOSIS — E079 Disorder of thyroid, unspecified: Secondary | ICD-10-CM | POA: Insufficient documentation

## 2017-06-22 DIAGNOSIS — E785 Hyperlipidemia, unspecified: Secondary | ICD-10-CM | POA: Insufficient documentation

## 2017-06-22 DIAGNOSIS — K449 Diaphragmatic hernia without obstruction or gangrene: Secondary | ICD-10-CM | POA: Insufficient documentation

## 2017-06-22 DIAGNOSIS — K219 Gastro-esophageal reflux disease without esophagitis: Secondary | ICD-10-CM | POA: Insufficient documentation

## 2017-06-22 DIAGNOSIS — Z79899 Other long term (current) drug therapy: Secondary | ICD-10-CM | POA: Diagnosis not present

## 2017-06-22 DIAGNOSIS — R112 Nausea with vomiting, unspecified: Secondary | ICD-10-CM | POA: Diagnosis not present

## 2017-06-22 DIAGNOSIS — R1013 Epigastric pain: Secondary | ICD-10-CM | POA: Insufficient documentation

## 2017-06-22 HISTORY — PX: ESOPHAGOGASTRODUODENOSCOPY: SHX5428

## 2017-06-22 HISTORY — PX: SAVORY DILATION: SHX5439

## 2017-06-22 SURGERY — EGD (ESOPHAGOGASTRODUODENOSCOPY)
Anesthesia: Moderate Sedation

## 2017-06-22 MED ORDER — MIDAZOLAM HCL 5 MG/5ML IJ SOLN
INTRAMUSCULAR | Status: AC
  Filled 2017-06-22: qty 10

## 2017-06-22 MED ORDER — MIDAZOLAM HCL 5 MG/5ML IJ SOLN
INTRAMUSCULAR | Status: DC | PRN
  Administered 2017-06-22 (×3): 2 mg via INTRAVENOUS

## 2017-06-22 MED ORDER — SODIUM CHLORIDE 0.9 % IV SOLN
INTRAVENOUS | Status: DC
  Administered 2017-06-22: 08:00:00 via INTRAVENOUS

## 2017-06-22 MED ORDER — MINERAL OIL PO OIL
TOPICAL_OIL | ORAL | Status: AC
  Filled 2017-06-22: qty 30

## 2017-06-22 MED ORDER — LIDOCAINE VISCOUS 2 % MT SOLN
OROMUCOSAL | Status: AC
  Filled 2017-06-22: qty 15

## 2017-06-22 MED ORDER — DEXLANSOPRAZOLE 60 MG PO CPDR: DELAYED_RELEASE_CAPSULE | ORAL | 3 refills | Status: DC

## 2017-06-22 MED ORDER — LIDOCAINE VISCOUS 2 % MT SOLN
OROMUCOSAL | Status: DC | PRN
  Administered 2017-06-22: 1 via OROMUCOSAL

## 2017-06-22 MED ORDER — MEPERIDINE HCL 100 MG/ML IJ SOLN
INTRAMUSCULAR | Status: DC | PRN
  Administered 2017-06-22: 25 mg via INTRAVENOUS
  Administered 2017-06-22: 50 mg via INTRAVENOUS
  Administered 2017-06-22: 25 mg via INTRAVENOUS

## 2017-06-22 MED ORDER — MEPERIDINE HCL 100 MG/ML IJ SOLN
INTRAMUSCULAR | Status: AC
  Filled 2017-06-22: qty 2

## 2017-06-22 NOTE — Op Note (Addendum)
[facility] Patient Name: Brooke Parsons Procedure Date: 06/22/2017 8:21 AM MRN: [id number] Date of Birth: 05-20-1971 Attending MD: Barney Drain , MD CSN: [id number] Age: 46 Admit Type: Outpatient Procedure:                Upper GI endoscopy WITH COLD FORCEPS                            BIOPSY/ESOPHAGEAL DILATION Indications:              Dyspepsia, Dysphagia Providers:                Barney Drain, MD, Lurline Del, RN, Aram Candela Referring MD:             Terald Sleeper, Medicines:                Meperidine 100 mg IV, Midazolam 6 mg IV Complications:            No immediate complications. Estimated Blood Loss:     Estimated blood loss was minimal. Procedure:                Pre-Anesthesia Assessment:                           - Prior to the procedure, a History and Physical                            was performed, and patient medications and                            allergies were reviewed. The patient's tolerance of                            previous anesthesia was also reviewed. The risks                            and benefits of the procedure and the sedation                            options and risks were discussed with the patient.                            All questions were answered, and informed consent                            was obtained. Prior Anticoagulants: The patient has                            taken ibuprofen. ASA Grade Assessment: II - A                            patient with mild systemic disease. After reviewing                            the risks and benefits, the patient was deemed in  satisfactory condition to undergo the procedure.                            After obtaining informed consent, the endoscope was                            passed under direct vision. Throughout the                            procedure, the patient's blood pressure, pulse, and                            oxygen saturations were monitored  continuously. The                            [id number] ([id number]) scope was introduced through the                            mouth, and advanced to the second part of duodenum.                            The upper GI endoscopy was accomplished without                            difficulty. The patient tolerated the procedure                            well. Scope In: 8:55:46 AM Scope Out: 9:06:27 AM Total Procedure Duration: 0 hours 10 minutes 41 seconds  Findings:      The examined esophagus was normal. Biopsies were obtained from the       proximal(18 CM FROM THE INCISORS) and distal esophagus(30 CM FROM THE       INCISORS) with cold forceps for histology of suspected eosinophilic       esophagitis.      Patchy mild inflammation characterized by congestion (edema) and       erythema was found in the gastric antrum. Biopsies were taken with a       cold forceps for Helicobacter pylori testing.      The examined duodenum was normal.      A medium-sized hiatal hernia was present. Impression:               - DYSPHAGIA MOST LIKELY DUE TO UNCONTROLLED REFLUX                           - MILD Gastritis DUE TO IBUPROFEN                           - MODERATE SIZE HIATAL HERNIA Moderate Sedation:      Moderate (conscious) sedation was administered by the endoscopy nurse       and supervised by the endoscopist. The following parameters were       monitored: oxygen saturation, heart rate, blood pressure, and response       to care. Total physician intraservice time was 23 minutes. Recommendation:           -  Await pathology results.                           - High fiber diet and low fat diet.                           - Continue present medications.                           - Return to my office in 4 months.                           - Patient has a contact number available for                            emergencies. The signs and symptoms of potential                            delayed  complications were discussed with the                            patient. Return to normal activities tomorrow.                            Written discharge instructions were provided to the                            patient. Procedure Code(s):        --- Professional ---                           [phone], Esophagogastroduodenoscopy, flexible,                            transoral; with biopsy, single or multiple                           [id number], Moderate sedation services provided by the                            same physician or other qualified health care                            professional performing the diagnostic or                            therapeutic service that the sedation supports,                            requiring the presence of an independent trained                            observer to assist in the monitoring of the  patient's level of consciousness and physiological                            status; initial 15 minutes of intraservice time,                            patient age 38 years or older                           [phone], Moderate sedation services; each additional                            15 minutes intraservice time Diagnosis Code(s):        --- Professional ---                           K29.70, Gastritis, unspecified, without bleeding                           R10.13, Epigastric pain                           R13.10, Dysphagia, unspecified CPT copyright 2016 [employer]. All rights reserved. The codes documented in this report are preliminary and upon coder review may  be revised to meet current compliance requirements. Barney Drain, MD Barney Drain, MD 06/22/2017 9:21:51 AM This report has been signed electronically. Number of Addenda: 0

## 2017-06-22 NOTE — Discharge Instructions (Signed)
YOUR SWALLOWING DIFFICULTY IS MOST LIKELY DUE TO UNCONTROLLED REFLUX. I dilated your esophagus DUE TO YOUR DIFFICULTY SWALLOWING. I DID NOT SEE A DEFINITE NARROWING IN YOUR esophagus BUT YOU DO HAVE A a small hiatal hernia. You have gastritis. I biopsied your esophagus and stomach.   DRINK WATER TO KEEP YOUR URINE LIGHT YELLOW.  CONTINUE YOUR WEIGHT LOSS EFFORTS.  AVOID REFLUX TRIGGERS. SEE INFO BELOW.  FOLLOW A LOW FAT DIET. SEE INFO BELOW.  CONTINUE DEXILANT.  YOUR BIOPSY RESULTS WILL BE AVAILABLE IN MY CHART AFTER AUG 5 AND MY OFFICE WILL CONTACT YOU IN 10-14 DAYS WITH YOUR RESULTS.    FOLLOW UP IN 4 mos. UPPER ENDOSCOPY AFTER CARE Read the instructions outlined below and refer to this sheet in the next week. These discharge instructions provide you with general information on caring for yourself after you leave the hospital. While your treatment has been planned according to the most current medical practices available, unavoidable complications occasionally occur. If you have any problems or questions after discharge, call DR. Rachell Druckenmiller, [phone].  ACTIVITY  You may resume your regular activity, but move at a slower pace for the next 24 hours.   Take frequent rest periods for the next 24 hours.   Walking will help get rid of the air and reduce the bloated feeling in your belly (abdomen).   No driving for 24 hours (because of the medicine (anesthesia) used during the test).   You may shower.   Do not sign any important legal documents or operate any machinery for 24 hours (because of the anesthesia used during the test).    NUTRITION  Drink plenty of fluids.   You may resume your normal diet as instructed by your doctor.   Begin with a light meal and progress to your normal diet. Heavy or fried foods are harder to digest and may make you feel sick to your stomach (nauseated).   Avoid alcoholic beverages for 24 hours or as instructed.    MEDICATIONS  You may  resume your normal medications.   WHAT YOU CAN EXPECT TODAY  Some feelings of bloating in the abdomen.   Passage of more gas than usual.    IF YOU HAD A BIOPSY TAKEN DURING THE UPPER ENDOSCOPY:  Eat a soft diet IF YOU HAVE NAUSEA, BLOATING, ABDOMINAL PAIN, OR VOMITING.    FINDING OUT THE RESULTS OF YOUR TEST Not all test results are available during your visit. DR. Oneida Alar WILL CALL YOU WITHIN 14 DAYS OF YOUR PROCEDUE WITH YOUR RESULTS. Do not assume everything is normal if you have not heard from DR. Rekita Miotke, CALL HER OFFICE AT [phone].  SEEK IMMEDIATE MEDICAL ATTENTION AND CALL THE OFFICE: [phone] IF:  You have more than a spotting of blood in your stool.   Your belly is swollen (abdominal distention).   You are nauseated or vomiting.   You have a temperature over 101F.   You have abdominal pain or discomfort that is severe or gets worse throughout the day.  Gastritis  Gastritis is an inflammation (the body's way of reacting to injury and/or infection) of the stomach. It is often caused by viral or bacterial (germ) infections. It can also be caused BY ASPIRIN, BC/GOODY POWDER'S, (IBUPROFEN) MOTRIN, OR ALEVE (NAPROXEN), chemicals (including alcohol), SPICY FOODS, and medications. This illness may be associated with generalized malaise (feeling tired, not well), UPPER ABDOMINAL STOMACH cramps, and fever. One common bacterial cause of gastritis is an organism known as H. Pylori. This can  be treated with antibiotics.   Hiatal Hernia A hiatal hernia occurs when a part of the stomach slides above the diaphragm. The diaphragm is the thin muscle separating the belly (abdomen) from the chest. A hiatal hernia can be something you are born with or develop over time. Hiatal hernias may allow stomach acid to flow back into your esophagus, the tube which carries food from your mouth to your stomach. If this acid causes problems it is called GERD (gastro-esophageal reflux disease).    SYMPTOMS Common symptoms of GERD are heartburn (burning in your chest). This is worse when lying down or bending over. It may also cause belching and indigestion. Some of the things which make GERD worse are:  Increased weight pushes on stomach making acid rise more easily.   Smoking markedly increases acid production.   Alcohol decreases lower esophageal sphincter pressure (valve between stomach and esophagus), allowing acid from stomach into esophagus.   Late evening meals and going to bed with a full stomach increases pressure.   HOME CARE INSTRUCTIONS  Try to achieve and maintain an ideal body weight.   Avoid drinking alcoholic beverages.   DO NOT smokE.   Do not wear tight clothing around your chest or stomach.   Eat smaller meals and eat more frequently. This keeps your stomach from getting too full. Eat slowly.   Do not lie down for 2 or 3 hours after eating. Do not eat or drink anything 1 to 2 hours before going to bed.   Avoid caffeine beverages (colas, coffee, cocoa, tea), fatty foods, citrus fruits and all other foods and drinks that contain acid and that seem to increase the problems.   Avoid bending over, especially after eating OR STRAINING. Anything that increases the pressure in your belly increases the amount of acid that may be pushed up into your esophagus.    DYSPHAGIA DYSPHAGIA can be caused by stomach acid backing up into the tube that carries food from the mouth down to the stomach (lower esophagus).  TREATMENT There are a number of  medicines used to treat DYSPHAGIA including: Antacids.  Proton-pump inhibitors: DEXILANT  HOME CARE INSTRUCTIONS Eat 2-3 hours before going to bed.  Try to reach and maintain a healthy weight.  Do not eat just a few very large meals. Instead, eat 4 TO 6 smaller meals throughout the day.  Try to identify foods and beverages that make your symptoms worse, and avoid these.  Avoid tight clothing.  Do not exercise right  after eating.     Lifestyle and home remedies TO MANAGE REFLUX/CHEST PAIN  You may eliminate or reduce the frequency of heartburn by making the following lifestyle changes:   Control your weight. Being overweight is a major risk factor for heartburn and GERD. Excess pounds put pressure on your abdomen, pushing up your stomach and causing acid to back up into your esophagus.    Eat smaller meals. 4 TO 6 MEALS A DAY. This reduces pressure on the lower esophageal sphincter, helping to prevent the valve from opening and acid from washing back into your esophagus.    Loosen your belt. Clothes that fit tightly around your waist put pressure on your abdomen and the lower esophageal sphincter.    Eliminate heartburn triggers. Everyone has specific triggers. Common triggers such as fatty or fried foods, spicy food, tomato sauce, carbonated beverages, alcohol, chocolate, mint, garlic, onion, caffeine and nicotine may make heartburn worse.    Avoid stooping or bending. Tying your  shoes is OK. Bending over for longer periods to weed your garden isn't, especially soon after eating.    Don't lie down after a meal. Wait at least three to four hours after eating before going to bed, and don't lie down right after eating.    PUT THE HEAD OF YOUR BED ON 6 INCH BLOCKS.   Alternative medicine  Several home remedies exist for treating GERD, but they provide only temporary relief. They include drinking baking soda (sodium bicarbonate) added to water or drinking other fluids such as baking soda mixed with cream of tartar and water.   Although these liquids create temporary relief by neutralizing, washing away or buffering acids, eventually they aggravate the situation by adding gas and fluid to your stomach, increasing pressure and causing more acid reflux. Further, adding more sodium to your diet may increase your blood pressure and add stress to your heart, and excessive bicarbonate ingestion can  alter the acid-base balance in your body.   Low-Fat Diet BREADS, CEREALS, PASTA, RICE, DRIED PEAS, AND BEANS These products are high in carbohydrates and most are low in fat. Therefore, they can be increased in the diet as substitutes for fatty foods. They too, however, contain calories and should not be eaten in excess. Cereals can be eaten for snacks as well as for breakfast.   FRUITS AND VEGETABLES It is good to eat fruits and vegetables. Besides being sources of fiber, both are rich in vitamins and some minerals. They help you get the daily allowances of these nutrients. Fruits and vegetables can be used for snacks and desserts.  MEATS Limit lean meat, chicken, [place], and fish to no more than 6 ounces per day. Beef, Pork, and Lamb Use lean cuts of beef, pork, and lamb. Lean cuts include:  Extra-lean ground beef.  Arm roast.  Sirloin tip.  Center-cut ham.  Round steak.  Loin chops.  Rump roast.  Tenderloin.  Trim all fat off the outside of meats before cooking. It is not necessary to severely decrease the intake of red meat, but lean choices should be made. Lean meat is rich in protein and contains a highly absorbable form of iron. Premenopausal women, in particular, should avoid reducing lean red meat because this could increase the risk for low red blood cells (iron-deficiency anemia).  Chicken and [place] These are good sources of protein. The fat of poultry can be reduced by removing the skin and underlying fat layers before cooking. Chicken and [place] can be substituted for lean red meat in the diet. Poultry should not be fried or covered with high-fat sauces. Fish and Shellfish Fish is a good source of protein. Shellfish contain cholesterol, but they usually are low in saturated fatty acids. The preparation of fish is important. Like chicken and [place], they should not be fried or covered with high-fat sauces. EGGS Egg whites contain no fat or cholesterol. They can be eaten  often. Try 1 to 2 egg whites instead of whole eggs in recipes or use egg substitutes that do not contain yolk. MILK AND DAIRY PRODUCTS Use skim or 1% milk instead of 2% or whole milk. Decrease whole milk, natural, and processed cheeses. Use nonfat or low-fat (2%) cottage cheese or low-fat cheeses made from vegetable oils. Choose nonfat or low-fat (1 to 2%) yogurt. Experiment with evaporated skim milk in recipes that call for heavy cream. Substitute low-fat yogurt or low-fat cottage cheese for sour cream in dips and salad dressings. Have at least 2 servings of  low-fat dairy products, such as 2 glasses of skim (or 1%) milk each day to help get your daily calcium intake. FATS AND OILS Reduce the total intake of fats, especially saturated fat. Butterfat, lard, and beef fats are high in saturated fat and cholesterol. These should be avoided as much as possible. Vegetable fats do not contain cholesterol, but certain vegetable fats, such as coconut oil, palm oil, and palm kernel oil are very high in saturated fats. These should be limited. These fats are often used in bakery goods, processed foods, popcorn, oils, and nondairy creamers. Vegetable shortenings and some peanut butters contain hydrogenated oils, which are also saturated fats. Read the labels on these foods and check for saturated vegetable oils. Unsaturated vegetable oils and fats do not raise blood cholesterol. However, they should be limited because they are fats and are high in calories. Total fat should still be limited to 30% of your daily caloric intake. Desirable liquid vegetable oils are corn oil, cottonseed oil, olive oil, canola oil, safflower oil, soybean oil, and sunflower oil. Peanut oil is not as good, but small amounts are acceptable. Buy a heart-healthy tub margarine that has no partially hydrogenated oils in the ingredients. Mayonnaise and salad dressings often are made from unsaturated fats, but they should also be limited because of  their high calorie and fat content. Seeds, nuts, peanut butter, olives, and avocados are high in fat, but the fat is mainly the unsaturated type. These foods should be limited mainly to avoid excess calories and fat. OTHER EATING TIPS Snacks  Most sweets should be limited as snacks. They tend to be rich in calories and fats, and their caloric content outweighs their nutritional value. Some good choices in snacks are graham crackers, melba toast, soda crackers, bagels (no egg), English muffins, fruits, and vegetables. These snacks are preferable to snack crackers, [place] fries, TORTILLA CHIPS, and POTATO chips. Popcorn should be air-popped or cooked in small amounts of liquid vegetable oil. Desserts Eat fruit, low-fat yogurt, and fruit ices instead of pastries, cake, and cookies. Sherbet, angel food cake, gelatin dessert, frozen low-fat yogurt, or other frozen products that do not contain saturated fat (pure fruit juice bars, frozen ice pops) are also acceptable.  COOKING METHODS Choose those methods that use little or no fat. They include: Poaching.  Braising.  Steaming.  Grilling.  Baking.  Stir-frying.  Broiling.  Microwaving.  Foods can be cooked in a nonstick pan without added fat, or use a nonfat cooking spray in regular cookware. Limit fried foods and avoid frying in saturated fat. Add moisture to lean meats by using water, broth, cooking wines, and other nonfat or low-fat sauces along with the cooking methods mentioned above. Soups and stews should be chilled after cooking. The fat that forms on top after a few hours in the refrigerator should be skimmed off. When preparing meals, avoid using excess salt. Salt can contribute to raising blood pressure in some people.  EATING AWAY FROM HOME Order entres, potatoes, and vegetables without sauces or butter. When meat exceeds the size of a deck of cards (3 to 4 ounces), the rest can be taken home for another meal. Choose vegetable or fruit  salads and ask for low-calorie salad dressings to be served on the side. Use dressings sparingly. Limit high-fat toppings, such as bacon, crumbled eggs, cheese, sunflower seeds, and olives. Ask for heart-healthy tub margarine instead of butter.

## 2017-06-22 NOTE — H&P (Addendum)
Primary Care Physician:  Terald Sleeper, PA-C Primary Gastroenterologist:  Dr. Oneida Alar  Pre-Procedure History & Physical: HPI:  Brooke Parsons is a 46 y.o. female here for DYSPEPSIA/DYSPHAGIA.   Past Medical History:  Diagnosis Date  . Cervical cancer ([place])   . GERD (gastroesophageal reflux disease)   . Hyperlipidemia   . Irritable bowel syndrome (IBS)   . Thyroid disease     Past Surgical History:  Procedure Laterality Date  . ABDOMINAL HYSTERECTOMY    . CARPAL TUNNEL RELEASE    . ESOPHAGOGASTRODUODENOSCOPY  02/2012   Dr. Britta Mccreedy: schatzki's ring, hh, duodenal bx negative for celiac.   . TONSILLECTOMY      Prior to Admission medications   Medication Sig Start Date End Date Taking? Authorizing Provider  acetaminophen (TYLENOL) 500 MG tablet Take 500 mg by mouth daily as needed for moderate pain or headache.   Yes [provider]  atorvastatin (LIPITOR) 20 MG tablet Take 1 tablet (20 mg total) by mouth daily. Patient taking differently: Take 20 mg by mouth daily at 6 PM.  04/03/17  Yes Hawks, Christy A, FNP  dexlansoprazole (DEXILANT) 60 MG capsule Take 1 capsule (60 mg total) by mouth daily. Patient taking differently: Take 60 mg by mouth daily before breakfast.  03/31/17  Yes Hawks, Christy A, FNP  levothyroxine (SYNTHROID) 75 MCG tablet Take 75 mcg by mouth at bedtime. Brand name only 12/15/16 12/15/17 Yes [provider]  sertraline (ZOLOFT) 50 MG tablet Take 50 mg by mouth daily.   Yes [provider]  sucralfate (CARAFATE) 1 GM/10ML suspension Take 10 mLs (1 g total) by mouth 4 (four) times daily -  with meals and at bedtime. As needed Patient not taking: Reported on 06/21/2017 05/23/17   Mahala Menghini, PA-C    Allergies as of 05/23/2017 - Review Complete 05/23/2017  Allergen Reaction Noted  . Clindamycin/lincomycin  05/20/2016  . Morphine and related  05/20/2016  . Synthroid [levothyroxine] Palpitations 05/20/2016    Family History  Problem  Relation Age of Onset  . Heart attack Mother   . Stroke Mother   . Heart attack Sister   . Heart attack Brother 41  . Heart attack Maternal Uncle   . Heart attack Maternal Grandfather   . Cancer Maternal Grandfather        throat  . Heart attack Father   . Cancer Paternal Grandfather   . Colon cancer Paternal Uncle     Social History   Social History  . Marital status: Married    Spouse name: N/A  . Number of children: N/A  . Years of education: N/A   Occupational History  . Not on file.   Social History Main Topics  . Smoking status: Former Smoker    Quit date: 12/14/2014  . Smokeless tobacco: Never Used  . Alcohol use No  . Drug use: No  . Sexual activity: Not on file   Other Topics Concern  . Not on file   Social History Narrative  . No narrative on file    Review of Systems: See HPI, otherwise negative ROS   Physical Exam: BP 104/67   Pulse 61   Temp 98.4 F (36.9 C) (Oral)   Resp 18   Ht 5\' 3"  (1.6 m)   Wt 174 lb (78.9 kg)   SpO2 98%   BMI 30.82 kg/m  General:   Alert,  pleasant and cooperative in NAD Head:  Normocephalic and atraumatic. Neck:  Supple; Lungs:  Clear throughout to auscultation.    Heart:  Regular rate and rhythm. Abdomen:  Soft, nontender and nondistended. Normal bowel sounds, without guarding, and without rebound.   Neurologic:  Alert and  oriented x4;  grossly normal neurologically.  Impression/Plan:     DYSPEPSIA/DYSPHAGIA   PLAN:  EGD/POSSIBLE DILATION TODAY. DISCUSSED PROCEDURE, BENEFITS, & RISKS: < 1% chance of medication reaction, bleeding, or perforation.

## 2017-06-26 ENCOUNTER — Encounter: Payer: Self-pay | Admitting: Gastroenterology

## 2017-06-26 NOTE — Progress Notes (Signed)
APPT MADE

## 2017-06-27 ENCOUNTER — Encounter (HOSPITAL_COMMUNITY): Payer: Self-pay | Admitting: Gastroenterology

## 2017-10-26 ENCOUNTER — Ambulatory Visit: Payer: 59 | Admitting: Gastroenterology

## 2017-11-06 ENCOUNTER — Encounter: Payer: Self-pay | Admitting: *Deleted

## 2017-11-06 ENCOUNTER — Encounter: Payer: Self-pay | Admitting: Gastroenterology

## 2017-11-06 ENCOUNTER — Telehealth: Payer: Self-pay | Admitting: *Deleted

## 2017-11-06 ENCOUNTER — Ambulatory Visit: Payer: 59 | Admitting: Gastroenterology

## 2017-11-06 VITALS — BP 108/75 | HR 75 | Temp 97.8°F | Ht 63.0 in | Wt 164.8 lb

## 2017-11-06 DIAGNOSIS — F458 Other somatoform disorders: Secondary | ICD-10-CM

## 2017-11-06 DIAGNOSIS — R198 Other specified symptoms and signs involving the digestive system and abdomen: Secondary | ICD-10-CM | POA: Insufficient documentation

## 2017-11-06 DIAGNOSIS — R7989 Other specified abnormal findings of blood chemistry: Secondary | ICD-10-CM | POA: Insufficient documentation

## 2017-11-06 DIAGNOSIS — K219 Gastro-esophageal reflux disease without esophagitis: Secondary | ICD-10-CM | POA: Diagnosis not present

## 2017-11-06 DIAGNOSIS — R945 Abnormal results of liver function studies: Secondary | ICD-10-CM | POA: Diagnosis not present

## 2017-11-06 DIAGNOSIS — R0989 Other specified symptoms and signs involving the circulatory and respiratory systems: Secondary | ICD-10-CM

## 2017-11-06 LAB — HEPATIC FUNCTION PANEL
AG Ratio: 1.3 (calc) (ref 1.0–2.5)
ALT: 14 U/L (ref 6–29)
AST: 13 U/L (ref 10–35)
Albumin: 4.1 g/dL (ref 3.6–5.1)
Alkaline phosphatase (APISO): 82 U/L (ref 33–115)
Bilirubin, Direct: 0.1 mg/dL (ref 0.0–0.2)
Globulin: 3.1 g/dL (calc) (ref 1.9–3.7)
Indirect Bilirubin: 0.2 mg/dL (calc) (ref 0.2–1.2)
Total Bilirubin: 0.3 mg/dL (ref 0.2–1.2)
Total Protein: 7.2 g/dL (ref 6.1–8.1)

## 2017-11-06 MED ORDER — DEXLANSOPRAZOLE 60 MG PO CPDR: DELAYED_RELEASE_CAPSULE | ORAL | 3 refills | Status: DC

## 2017-11-06 NOTE — Telephone Encounter (Signed)
Checked [facility] Website and pt BPE does not need precert.

## 2017-11-06 NOTE — Progress Notes (Signed)
Referring Provider: Terald Sleeper, PA-C Primary Care Physician:  Terald Sleeper, PA-C  Primary GI: Dr. Oneida Alar   Chief Complaint  Patient presents with  . Gastroesophageal Reflux    Dexilant helping  . Dysphagia    food/pills; feels like something stuck in throat    HPI:   Brooke Parsons is a 46 y.o. female presenting today with a history of GERD, gastritis, recent EGD with mild gastritis due to NSAIDs, moderate size hiatal hernia. Mildly elevated ALT historically, serially followed.    Dexilant helps with reflux. Feels a globus sensation intermittently but improved from what it was. Occasional dysphagia and points to neck region. Sometimes abdominal pain. Avoiding Ibuprofen. Stopped cheese, which helped with mucus. Lost 10 lbs since 07/25/17. Mother passed away a few months ago, so she has been dealing with decreased appetite in the setting of grief. She states she takes Zoloft "occasionally" and hasn't had in 2 months.   Past Medical History:  Diagnosis Date  . Cervical cancer ([place])   . GERD (gastroesophageal reflux disease)   . Hyperlipidemia   . Irritable bowel syndrome (IBS)   . Thyroid disease     Past Surgical History:  Procedure Laterality Date  . ABDOMINAL HYSTERECTOMY    . CARPAL TUNNEL RELEASE    . ESOPHAGOGASTRODUODENOSCOPY  02/2012   Dr. Britta Mccreedy: schatzki's ring, hh, duodenal bx negative for celiac.   Marland Kitchen ESOPHAGOGASTRODUODENOSCOPY N/A 06/22/2017   Dr. Oneida Alar: mild gastritis due to Ibuprofen, moderate size hiatal hernia (path with reactive gastropathy/chemical gastritis) Negative H.pylori.   Marland Kitchen SAVORY DILATION N/A 06/22/2017   Procedure: SAVORY DILATION;  Surgeon: Danie Binder, MD;  Location: [facility] SU[facility];  Service: Endoscopy;  Laterality: N/A;  . TONSILLECTOMY      Current Outpatient Medications  Medication Sig Dispense Refill  . acetaminophen (TYLENOL) 500 MG tablet Take 500 mg by mouth daily as needed for moderate pain or headache.    Marland Kitchen atorvastatin  (LIPITOR) 20 MG tablet Take 1 tablet (20 mg total) by mouth daily. (Patient taking differently: Take 20 mg by mouth daily at 6 PM. ) 90 tablet 3  . dexlansoprazole (DEXILANT) 60 MG capsule 1 po qam with first meal 90 capsule 3  . levothyroxine (SYNTHROID) 75 MCG tablet Take 75 mcg by mouth at bedtime. Brand name only    . sertraline (ZOLOFT) 50 MG tablet Take 50 mg by mouth daily. Doesn't take everyday     No current facility-administered medications for this visit.     Allergies as of 11/06/2017 - Review Complete 11/06/2017  Allergen Reaction Noted  . Morphine and related  05/20/2016  . Clindamycin/lincomycin Hives and Rash 05/20/2016  . Synthroid [levothyroxine] Palpitations 05/20/2016    Family History  Problem Relation Age of Onset  . Heart attack Mother   . Stroke Mother   . Heart attack Sister   . Heart attack Brother 32  . Heart attack Maternal Uncle   . Heart attack Maternal Grandfather   . Cancer Maternal Grandfather        throat  . Heart attack Father   . Cancer Paternal Grandfather   . Colon cancer Paternal Uncle     Social History   Socioeconomic History  . Marital status: Married    Spouse name: None  . Number of children: None  . Years of education: None  . Highest education level: None  Social Needs  . Financial resource strain: None  . Food insecurity -  worry: None  . Food insecurity - inability: None  . Transportation needs - medical: None  . Transportation needs - non-medical: None  Occupational History  . None  Tobacco Use  . Smoking status: Current Every Day Smoker    Types: E-cigarettes    Last attempt to quit: 12/14/2014    Years since quitting: 2.8  . Smokeless tobacco: Never Used  Substance and Sexual Activity  . Alcohol use: No  . Drug use: No  . Sexual activity: None  Other Topics Concern  . None  Social History Narrative  . None    Review of Systems: Gen: Denies fever, chills, anorexia. Denies fatigue, weakness, weight loss.    CV: Denies chest pain, palpitations, syncope, peripheral edema, and claudication. Resp: Denies dyspnea at rest, cough, wheezing, coughing up blood, and pleurisy. GI: see HPI  Derm: Denies rash, itching, dry skin Psych: Denies depression, anxiety, memory loss, confusion. No homicidal or suicidal ideation.  Heme: Denies bruising, bleeding, and enlarged lymph nodes.  Physical Exam: BP 108/75   Pulse 75   Temp 97.8 F (36.6 C) (Oral)   Ht 5\' 3"  (1.6 m)   Wt 164 lb 12.8 oz (74.8 kg)   BMI 29.19 kg/m  General:   Alert and oriented. No distress noted. Pleasant and cooperative.  Head:  Normocephalic and atraumatic. Eyes:  Conjuctiva clear without scleral icterus. Mouth:  Oral mucosa pink and moist.  Abdomen:  +BS, soft, non-tender and non-distended. No rebound or guarding. No HSM or masses noted. Msk:  Symmetrical without gross deformities. Normal posture. Extremities:  Without edema. Neurologic:  Alert and  oriented x4 Psych:  Alert and cooperative. Normal mood and affect.  Lab Results  Component Value Date   ALT 36 (H) 05/23/2017   AST 20 05/23/2017   ALKPHOS 82 05/23/2017   BILITOT 0.4 05/23/2017

## 2017-11-06 NOTE — Patient Instructions (Addendum)
Continue Dexilant once daily.  I have ordered an xray of your esophagus. We are also checking your liver numbers today again.   We will see you in 3 months, and we will be in touch once the results are available with recommendations!

## 2017-11-06 NOTE — Assessment & Plan Note (Addendum)
46 year old female with chronic GERD, s/p EGD with gastritis, improvement noted on Dexilant. However, she continues to note a globus sensation intermittently along with persistent solid food and pill dysphagia. Likely multifactorial in setting of dietary behaviors (drinking 4 pepsi's a day) and uncontrolled GERD. However, we will pursue BPE in near future. Anticipate referral to ENT. Continue Dexilant with dietary and behavior modifications. Continue weight loss. 3 month return.

## 2017-11-06 NOTE — Assessment & Plan Note (Signed)
Mildly elevated ALT in past. Recheck now.

## 2017-11-06 NOTE — Assessment & Plan Note (Signed)
BPE as planned. Likely ENT referral but patient would like to wait on findings from BPE first.

## 2017-11-07 NOTE — Progress Notes (Signed)
cc'ed to pcp °

## 2017-11-20 ENCOUNTER — Ambulatory Visit (HOSPITAL_COMMUNITY)
Admission: RE | Admit: 2017-11-20 | Discharge: 2017-11-20 | Disposition: A | Discharge status: Home / Self Care | Payer: 59 | Source: Ambulatory Visit | Attending: Gastroenterology | Admitting: Gastroenterology

## 2017-11-20 DIAGNOSIS — F458 Other somatoform disorders: Secondary | ICD-10-CM | POA: Insufficient documentation

## 2017-11-20 DIAGNOSIS — R0989 Other specified symptoms and signs involving the circulatory and respiratory systems: Secondary | ICD-10-CM

## 2017-11-20 DIAGNOSIS — R198 Other specified symptoms and signs involving the digestive system and abdomen: Secondary | ICD-10-CM

## 2017-11-28 NOTE — Progress Notes (Signed)
Brooke Parsons: your esophagus xray showed normal esophagus motility, no other concerns. I would like to refer you to ENT due to your symptoms. Is it ok with you if we refer?

## 2018-02-05 ENCOUNTER — Ambulatory Visit: Payer: 59 | Admitting: Gastroenterology

## 2018-02-06 ENCOUNTER — Other Ambulatory Visit: Payer: Self-pay | Admitting: Oral Surgery

## 2018-03-29 ENCOUNTER — Other Ambulatory Visit: Payer: Self-pay | Admitting: Family

## 2018-04-23 ENCOUNTER — Other Ambulatory Visit: Payer: Self-pay | Admitting: *Deleted

## 2018-04-23 ENCOUNTER — Encounter: Payer: Self-pay | Admitting: Gastroenterology

## 2018-04-23 ENCOUNTER — Ambulatory Visit: Payer: 59 | Admitting: Gastroenterology

## 2018-04-23 VITALS — BP 104/71 | HR 68 | Temp 98.4°F | Ht 63.0 in | Wt 162.2 lb

## 2018-04-23 DIAGNOSIS — K219 Gastro-esophageal reflux disease without esophagitis: Secondary | ICD-10-CM | POA: Diagnosis not present

## 2018-04-23 DIAGNOSIS — R198 Other specified symptoms and signs involving the digestive system and abdomen: Secondary | ICD-10-CM

## 2018-04-23 DIAGNOSIS — R0989 Other specified symptoms and signs involving the circulatory and respiratory systems: Secondary | ICD-10-CM

## 2018-04-23 DIAGNOSIS — F458 Other somatoform disorders: Secondary | ICD-10-CM

## 2018-04-23 DIAGNOSIS — K580 Irritable bowel syndrome with diarrhea: Secondary | ICD-10-CM | POA: Diagnosis not present

## 2018-04-23 DIAGNOSIS — K589 Irritable bowel syndrome without diarrhea: Secondary | ICD-10-CM | POA: Insufficient documentation

## 2018-04-23 MED ORDER — HYOSCYAMINE SULFATE SL 0.125 MG SL SUBL: 1.0000 | SUBLINGUAL_TABLET | SUBLINGUAL | 3 refills | Status: DC | PRN

## 2018-04-23 NOTE — Patient Instructions (Signed)
Continue Dexilant. I have also referred you to the Ear, Nose, and Throat specialist.  For abdominal cramping and looser stool, start taking Levsin as needed every 4 hours. Monitor for constipation, dry mouth. Call if this is not helpful.  You can also start a probiotic over the counter like Align, Restora, Digestive Advantage, [employer] Colon Health, etc. I have given you samples to try.   I would recommend calling about resuming Zoloft. I think this would be helpful for you!  We will see you in 4-6 months!  It was a pleasure to see you today. I strive to create trusting relationships with patients to provide genuine, compassionate, and quality care. I value your feedback. If you receive a survey regarding your visit,  I greatly appreciate you taking time to fill this out.   Annitta Needs, PhD, ANP-BC [facility]

## 2018-04-23 NOTE — Progress Notes (Signed)
Primary Care Physician:  Terald Sleeper, PA-C  Primary GI: Dr. Oneida Alar   Chief Complaint  Patient presents with  . Gastroesophageal Reflux    HPI:   Brooke Parsons is a 47 y.o. female presenting today with a history of GERD, gastritis, recent EGD with mild gastritis due to NSAIDs, moderate size hiatal hernia. Mildly elevated ALT historically, serially followed. Last seen in Dec 2018 with improvement of reflux on Dexilant. Normal BPE Dec 2018. Most recent HFP in Dec 2018 normal.   Globus sensation improved but still present. Has burning up into the back of her ears. Uses a lot of nasal spray, Neti pot to help. Staying off cheese.   Has alternating constipation and diarrhea but feels it is more diarrhea than constipation. Not able to go out to eat due to postprandial urgency. Had taken amitriptylline in the past, prescribed by Dr. Britta Mccreedy. Has cramping in abdomen, bloating occasionally. Feels bloated at times in the evenings. Milk causes discomfort. Has been on Zoloft in the past, which was helpful for anxiety. She stopped this.    Past Medical History:  Diagnosis Date  . Cervical cancer ([place])   . GERD (gastroesophageal reflux disease)   . Hyperlipidemia   . Irritable bowel syndrome (IBS)   . Thyroid disease     Past Surgical History:  Procedure Laterality Date  . ABDOMINAL HYSTERECTOMY    . CARPAL TUNNEL RELEASE    . ESOPHAGOGASTRODUODENOSCOPY  02/2012   Dr. Britta Mccreedy: schatzki's ring, hh, duodenal bx negative for celiac.   Marland Kitchen ESOPHAGOGASTRODUODENOSCOPY N/A 06/22/2017   Dr. Oneida Alar: mild gastritis due to Ibuprofen, moderate size hiatal hernia (path with reactive gastropathy/chemical gastritis) Negative H.pylori.   Marland Kitchen SAVORY DILATION N/A 06/22/2017   Procedure: SAVORY DILATION;  Surgeon: Danie Binder, MD;  Location: [facility];  Service: Endoscopy;  Laterality: N/A;  . TONSILLECTOMY      Current Outpatient Medications  Medication Sig Dispense Refill  . acetaminophen (TYLENOL)  500 MG tablet Take 500 mg by mouth daily as needed for moderate pain or headache.    . dexlansoprazole (DEXILANT) 60 MG capsule 1 po qam with first meal 90 capsule 3  . levothyroxine (SYNTHROID) 75 MCG tablet Take 75 mcg by mouth at bedtime. Brand name only    . Hyoscyamine Sulfate SL (LEVSIN/SL) 0.125 MG SUBL Place 1 tablet under the tongue every 4 (four) hours as needed. For abdominal pain and loose stool 120 each 3   No current facility-administered medications for this visit.     Allergies as of 04/23/2018 - Review Complete 04/23/2018  Allergen Reaction Noted  . Morphine and related  05/20/2016  . Clindamycin/lincomycin Hives and Rash 05/20/2016  . Synthroid [levothyroxine] Palpitations 05/20/2016    Family History  Problem Relation Age of Onset  . Heart attack Mother   . Stroke Mother   . Heart attack Sister   . Heart attack Brother 73  . Heart attack Maternal Uncle   . Heart attack Maternal Grandfather   . Cancer Maternal Grandfather        throat  . Heart attack Father   . Cancer Paternal Grandfather   . Colon cancer Paternal Uncle     Social History   Socioeconomic History  . Marital status: Married    Spouse name: Not on file  . Number of children: Not on file  . Years of education: Not on file  . Highest education level: Not on file  Occupational History  . Not  on file  Social Needs  . Financial resource strain: Not on file  . Food insecurity:    Worry: Not on file    Inability: Not on file  . Transportation needs:    Medical: Not on file    Non-medical: Not on file  Tobacco Use  . Smoking status: Current Every Day Smoker    Types: E-cigarettes    Last attempt to quit: 12/14/2014    Years since quitting: 3.3  . Smokeless tobacco: Never Used  Substance and Sexual Activity  . Alcohol use: No  . Drug use: No  . Sexual activity: Not on file  Lifestyle  . Physical activity:    Days per week: Not on file    Minutes per session: Not on file  . Stress:  Not on file  Relationships  . Social connections:    Talks on phone: Not on file    Gets together: Not on file    Attends religious service: Not on file    Active member of club or organization: Not on file    Attends meetings of clubs or organizations: Not on file    Relationship status: Not on file  Other Topics Concern  . Not on file  Social History Narrative  . Not on file    Review of Systems: As mentioned in HPI   Physical Exam: BP 104/71   Pulse 68   Temp 98.4 F (36.9 C) (Oral)   Ht 5\' 3"  (1.6 m)   Wt 162 lb 3.2 oz (73.6 kg)   BMI 28.73 kg/m  General:   Alert and oriented. No distress noted. Pleasant and cooperative.  Head:  Normocephalic and atraumatic. Eyes:  Conjuctiva clear without scleral icterus. Mouth:  Oral mucosa pink and moist. Abdomen:  +BS, soft, non-tender and non-distended. No rebound or guarding. No HSM or masses noted. Msk:  Symmetrical without gross deformities. Normal posture. Extremities:  Without edema. Neurologic:  Alert and  oriented x4 Psych:  Alert and cooperative. Normal mood and affect.

## 2018-04-24 ENCOUNTER — Other Ambulatory Visit: Payer: Self-pay | Admitting: *Deleted

## 2018-04-24 ENCOUNTER — Encounter: Payer: Self-pay | Admitting: *Deleted

## 2018-04-24 DIAGNOSIS — R748 Abnormal levels of other serum enzymes: Secondary | ICD-10-CM

## 2018-04-26 NOTE — Assessment & Plan Note (Signed)
Continue Dexilant once daily 

## 2018-04-26 NOTE — Assessment & Plan Note (Signed)
Chronic GERD, doing well with Linzess. Globus sensation improved but still present. Normal BPE Dec 2018. Refer to ENT.

## 2018-04-26 NOTE — Assessment & Plan Note (Signed)
Alternating constipation and diarrhea but more diarrhea predominant. Trial of Levsin prn. I have encouraged her to call her PCP regarding resuming Zoloft, as this worked well for anxiety in the past.

## 2018-04-27 NOTE — Progress Notes (Signed)
CC'D TO PCP °

## 2018-06-04 LAB — HEPATIC FUNCTION PANEL
AG Ratio: 1.4 (calc) (ref 1.0–2.5)
ALT: 11 U/L (ref 6–29)
AST: 15 U/L (ref 10–35)
Albumin: 4 g/dL (ref 3.6–5.1)
Alkaline phosphatase (APISO): 90 U/L (ref 33–115)
Bilirubin, Direct: 0 mg/dL (ref 0.0–0.2)
Globulin: 2.8 g/dL (calc) (ref 1.9–3.7)
Indirect Bilirubin: 0.3 mg/dL (calc) (ref 0.2–1.2)
Total Bilirubin: 0.3 mg/dL (ref 0.2–1.2)
Total Protein: 6.8 g/dL (ref 6.1–8.1)

## 2018-06-14 IMAGING — RF DG ESOPHAGUS
11 series · 12 of 24 positions shown · non-contrast
Comparison: None

CLINICAL DATA: Dysphagia, globus sensation, GERD for years,
worsening symptoms over past year

EXAM:
ESOPHOGRAM / BARIUM SWALLOW / BARIUM TABLET STUDY
TECHNIQUE: Combined double contrast and single contrast examination performed
using effervescent crystals, thick barium liquid, and thin barium
liquid. The patient was observed with fluoroscopy swallowing a 13 mm
barium sulphate tablet.
FLUOROSCOPY TIME:  Fluoroscopy Time:  1 minutes 18 seconds
Radiation Exposure Index (if provided by the fluoroscopic device):
23.6 mGy
Number of Acquired Spot Images: multiple fluoroscopic screen
captures

[Series 1: cp_standard · 0.18mm/px · 1 of 62 frames shown (1 of 11)]
[frame 33/62]
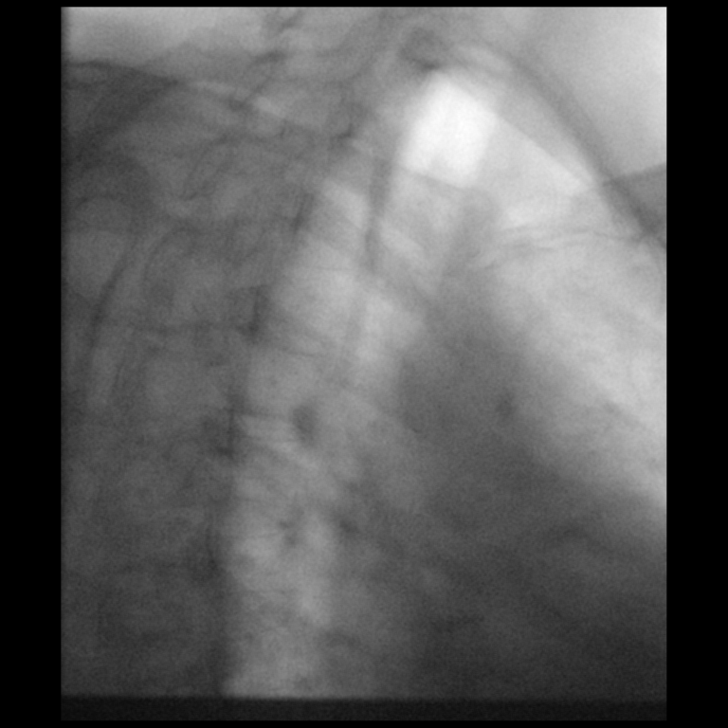

[Series 2: cp_standard · 0.18mm/px · 1 of 80 frames shown (2 of 11)]
[frame 41/80]
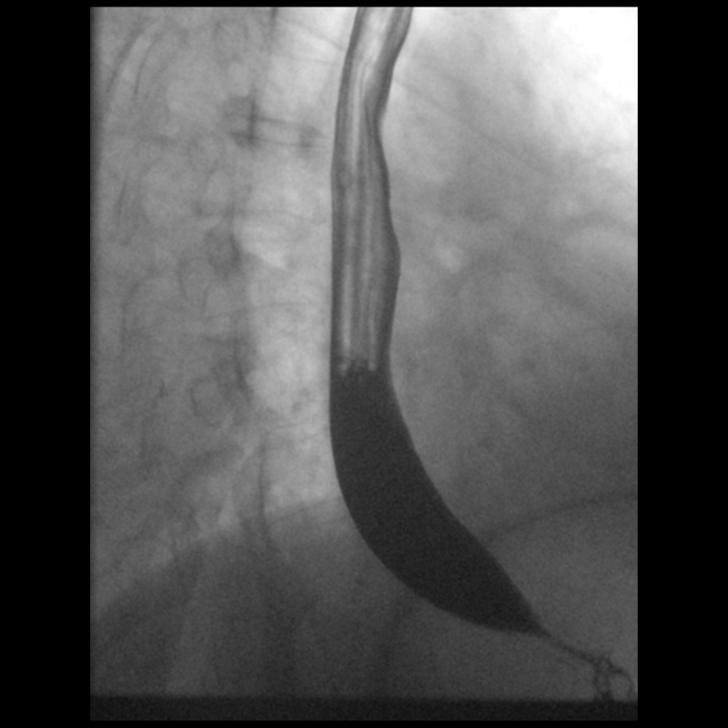

[Series 3: cp_standard · 0.18mm/px · 1 of 45 frames shown (3 of 11)]
[frame 23/45]
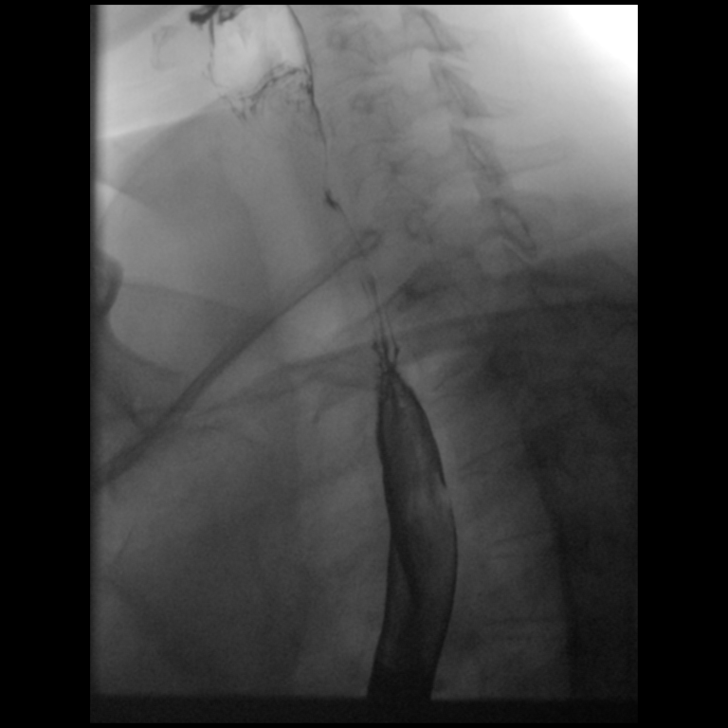

[Series 5: cp_standard · 0.18mm/px · 1 of 66 frames shown (4 of 11)]
[frame 6/66]
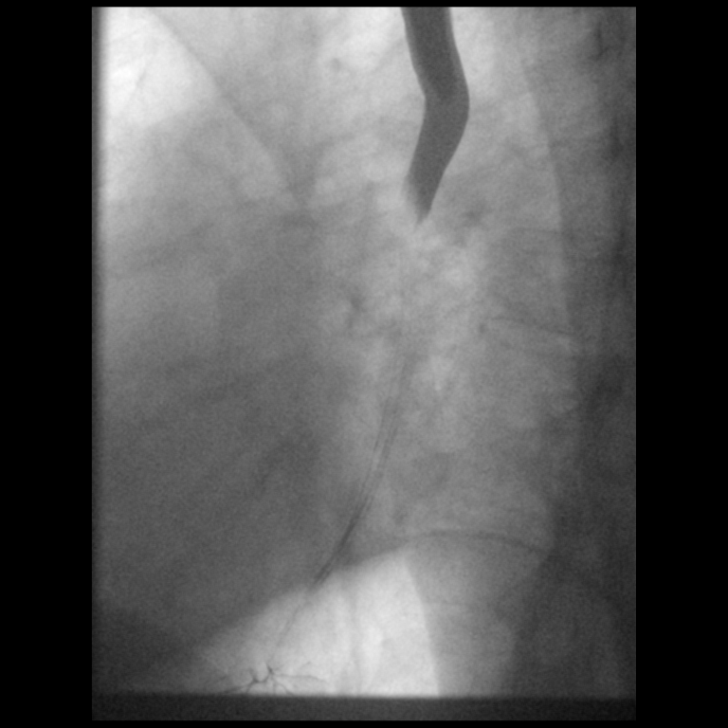

[Series 6: cp_standard · 0.27mm/px · 1 of 138 frames shown (5 of 11)]
[frame 10/138]
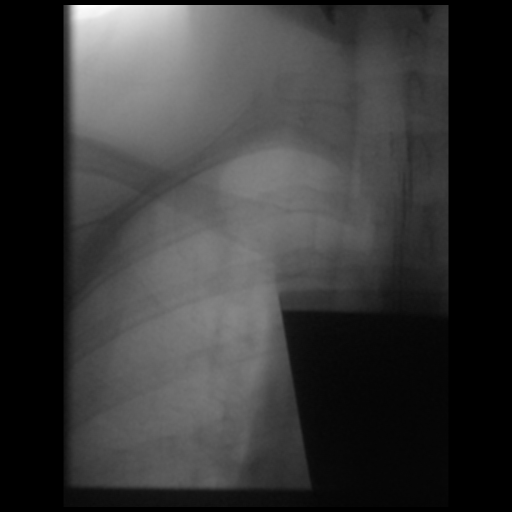

[Series 7: cp_standard · 0.26mm/px · 2 of 83 frames shown (6 of 11)]
[frame 13/83]
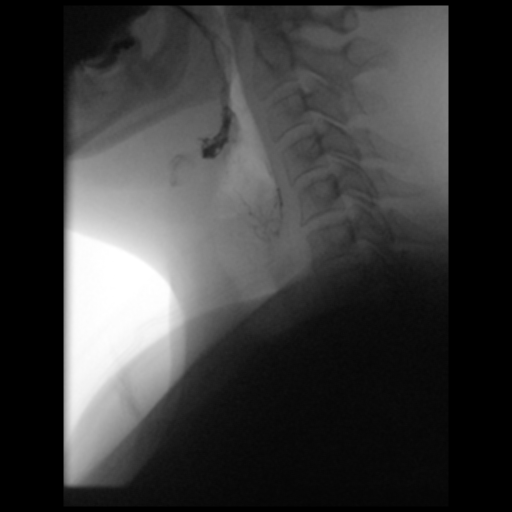
[frame 71/83]
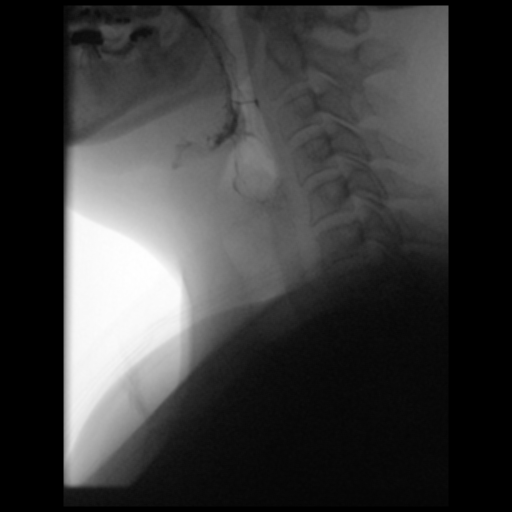

[Series 8: cp_standard · 0.18mm/px · 1 of 32 frames shown (7 of 11)]
[frame 28/32]
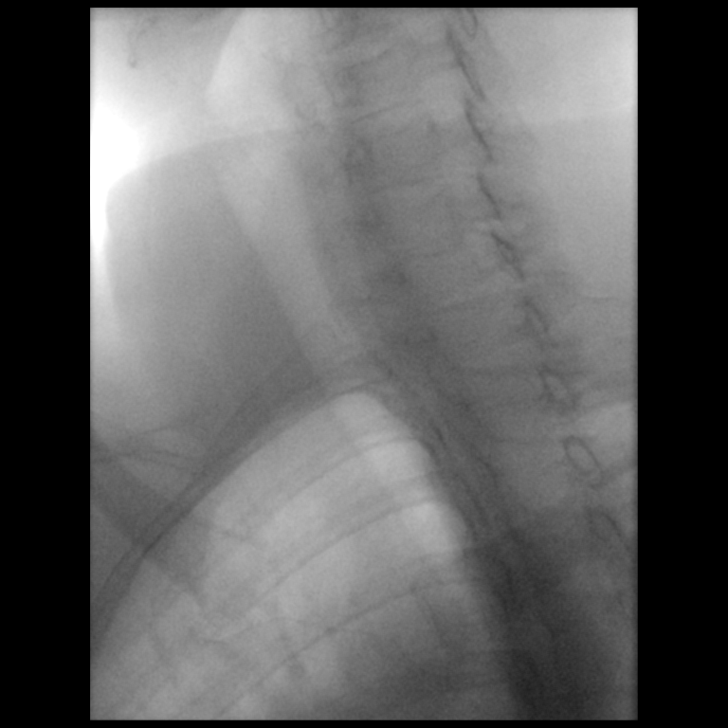

[Series 9: cp_standard · 0.18mm/px · 1 of 45 frames shown (8 of 11)]
[frame 39/45]
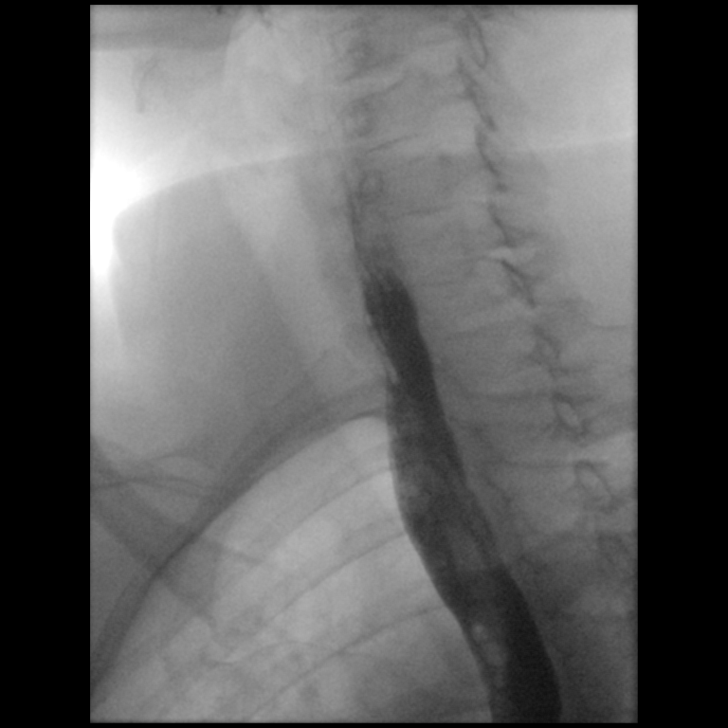

[Series 10: cp_standard · 0.18mm/px · 1 of 20 frames shown (9 of 11)]
[frame 18/20]
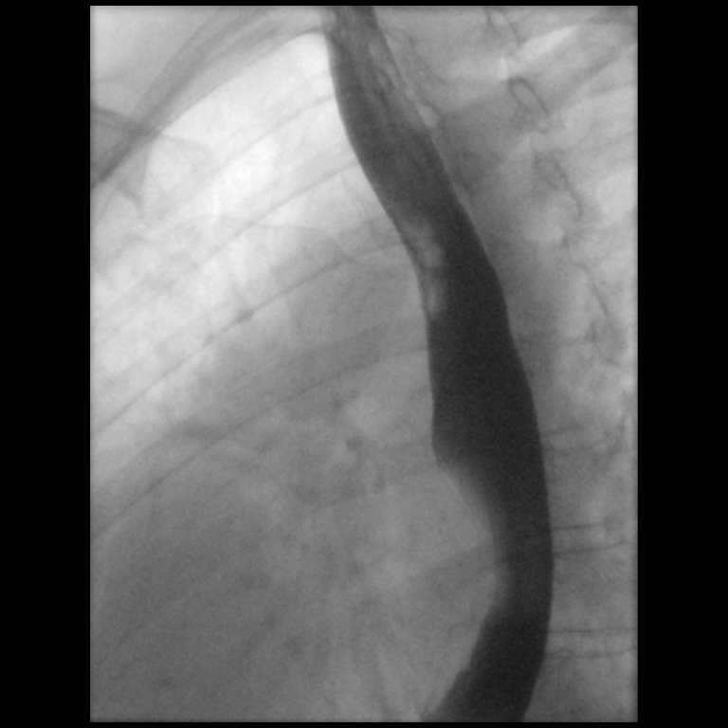

[Series 11: cp_standard · 0.19mm/px · 1 of 57 frames shown (10 of 11)]
[frame 29/57]
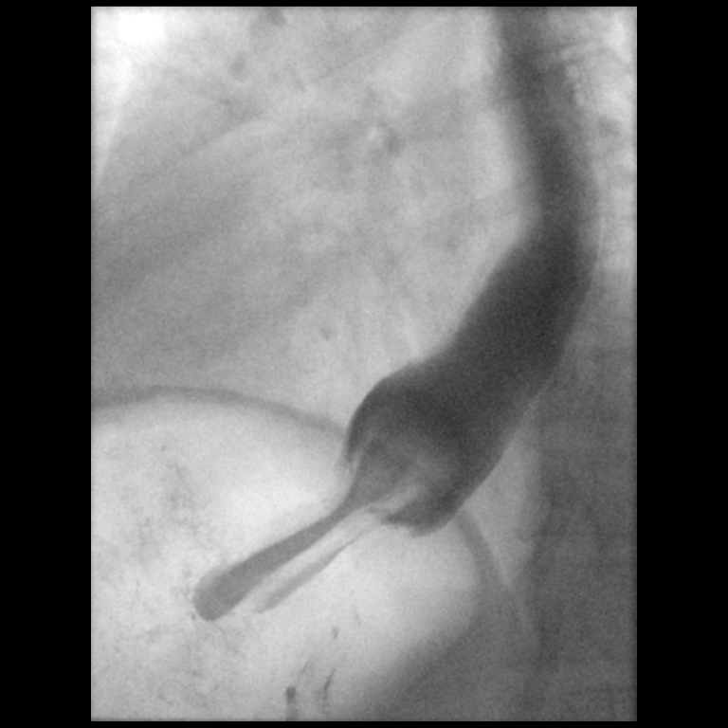

[Series 12: cp_standard · 0.19mm/px · 1 of 109 frames shown (11 of 11)]
[frame 93/109]
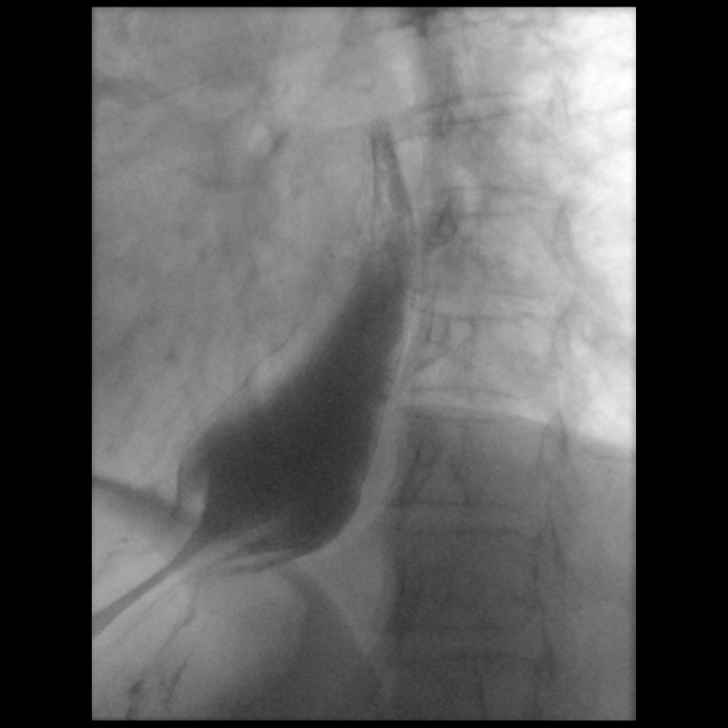

[12 of 24 positions shown; findings below may reference images not displayed]

FINDINGS: Esophageal distention: Normal distention without mass or stricture

Filling defects:  None

12.5 mm barium tablet: Passed from oral cavity to stomach without
obstruction or delay

Motility:  Normal for age

Mucosa:  Smooth without irregularity or ulceration

Hypopharynx/cervical esophagus: Normal motion without laryngeal
penetration or aspiration. No residuals. Minimally prominent
cricopharyngeus muscle incidentally noted.

Hiatal hernia:  Not visualized during exam

GE reflux:  Not visualized during exam

Other:  N/A
IMPRESSION: Essentially normal esophagram.

## 2018-06-14 NOTE — Progress Notes (Signed)
PT is aware.

## 2018-07-05 ENCOUNTER — Telehealth: Payer: Self-pay | Admitting: Gastroenterology

## 2018-07-05 NOTE — Telephone Encounter (Signed)
Heather from ENT wanted to know where patient had her DG Esophagus done at. I advised her [facility]. She is going to contact patient and see if she can go by there to pick up images on a disk. She will let me know if patient is not able.

## 2018-07-05 NOTE — Telephone Encounter (Signed)
Heather from [facility] was calling to speak with MS about patient. [phone]

## 2018-08-28 ENCOUNTER — Telehealth: Payer: Self-pay | Admitting: Gastroenterology

## 2018-08-28 ENCOUNTER — Ambulatory Visit: Payer: 59 | Admitting: Gastroenterology

## 2018-08-28 ENCOUNTER — Encounter: Payer: Self-pay | Admitting: Gastroenterology

## 2018-08-28 NOTE — Telephone Encounter (Signed)
PATIENT WAS A NO SHOW AND LETTER SENT  °

## 2018-09-11 ENCOUNTER — Other Ambulatory Visit: Payer: Self-pay | Admitting: Gastroenterology

## 2018-09-11 DIAGNOSIS — K219 Gastro-esophageal reflux disease without esophagitis: Secondary | ICD-10-CM

## 2018-12-05 ENCOUNTER — Telehealth: Payer: Self-pay | Admitting: Gastroenterology

## 2018-12-05 ENCOUNTER — Ambulatory Visit: Payer: 59 | Admitting: Gastroenterology

## 2018-12-05 ENCOUNTER — Encounter: Payer: Self-pay | Admitting: Gastroenterology

## 2018-12-05 VITALS — BP 113/77 | HR 71 | Temp 98.8°F | Ht 63.0 in | Wt 173.0 lb

## 2018-12-05 DIAGNOSIS — K219 Gastro-esophageal reflux disease without esophagitis: Secondary | ICD-10-CM

## 2018-12-05 MED ORDER — PANTOPRAZOLE SODIUM 40 MG PO TBEC: 40.0000 mg | DELAYED_RELEASE_TABLET | Freq: Two times a day (BID) | ORAL | 5 refills | Status: DC

## 2018-12-05 MED ORDER — SUCRALFATE 1 GM/10ML PO SUSP: 1.0000 g | Freq: Three times a day (TID) | ORAL | 0 refills | Status: DC

## 2018-12-05 NOTE — Patient Instructions (Signed)
Stop Dexilant.   Start pantoprazole once daily 30-60 minutes before breakfast and evening meal.   Add Carafate up to four times daily to soothe esophagus and stomach. This is a short term treatment.   Call in 2 weeks and let me know how you are doing.   Call after you complete upcoming test with ENT.   Return to the office in six months.

## 2018-12-05 NOTE — Progress Notes (Signed)
Primary Care Physician: Terald Sleeper, PA-C  Primary Gastroenterologist:  Barney Drain, MD   Chief Complaint  Patient presents with  . Gastroesophageal Reflux    HPI: Brooke Parsons is a 48 y.o. female here for follow-up.  She was last seen in June.  She has a history of GERD, gastritis, moderate sized hiatal hernia.  EGD in 2018 showed mild gastritis due to NSAIDs.  History of mildly elevated ALT.  She had a normal BPE December 2018.  For persistent globus sensation she was referred to ENT after her last office visit.  She had a transnasal flexible laryngoscopy in September showing laryngeal edema felt to be secondary to acid reflux.  According to ENT note there are plans for a modified barium swallow study, referral for sleep study, throat clearing techniques.  Patient states she has not had any additional studies because of follow-up in March.  She believes she is having the modified barium swallow study at that time.  She states she was told she might need Botox to the muscle that is not relaxing well and causing her to have issues with phlegm and globus.  Her weight is up 10 pounds since June.  She states her reflux has never really been completely controlled to her office visit.  However over the past few weeks she has had more breakthrough heartburn and now with burning in the epigastrium/central abdomen.  No ASA/NSAIDs. Breakthrough heartburn every day.  The last few days she has had constant burning in her stomach.  She spit up this morning.  She feels burning up into her ears.   Bowel movements alternate between constipation and diarrhea, slightly more predominant towards diarrhea.  She takes Levsin rarely, stating that it never really seemed to help.  As far as Dexilant she takes about 630 to 7:00 in the morning.  Typically eats about an hour and a half later.  She had been on Zantac until recent reports concerning cancer.  Sometimes she will take milk of magnesia to coat  her esophagus.  Previously failed omeprazole.    Current Outpatient Medications  Medication Sig Dispense Refill  . acetaminophen (TYLENOL) 500 MG tablet Take 500 mg by mouth daily as needed for moderate pain or headache.    . dexlansoprazole (DEXILANT) 60 MG capsule TAKE (1) CAPSULE BY MOUTH IN THE MORNING WITH FIRST MEAL. 60 capsule 3  . Hyoscyamine Sulfate SL (LEVSIN/SL) 0.125 MG SUBL Place 1 tablet under the tongue every 4 (four) hours as needed. For abdominal pain and loose stool 120 each 3  . levothyroxine (SYNTHROID) 75 MCG tablet Take 75 mcg by mouth at bedtime. Brand name only    . sertraline (ZOLOFT) 50 MG tablet Take 1 tablet by mouth daily.     No current facility-administered medications for this visit.     Allergies as of 12/05/2018 - Review Complete 12/05/2018  Allergen Reaction Noted  . Morphine and related  05/20/2016  . Clindamycin/lincomycin Hives and Rash 05/20/2016  . Synthroid [levothyroxine] Palpitations 05/20/2016    ROS:  General: Negative for anorexia, weight loss, fever, chills, fatigue, weakness. ENT: Negative for hoarseness, difficulty swallowing , nasal congestion.  See HPI CV: Negative for chest pain, angina, palpitations, dyspnea on exertion, peripheral edema.  Respiratory: Negative for dyspnea at rest, dyspnea on exertion, cough, sputum, wheezing.  GI: See history of present illness. GU:  Negative for dysuria, hematuria, urinary incontinence, urinary frequency, nocturnal urination.  Endo: Negative for unusual weight change.  Physical Examination:   BP 113/77   Pulse 71   Temp 98.8 F (37.1 C) (Oral)   Ht 5\' 3"  (1.6 m)   Wt 173 lb (78.5 kg)   BMI 30.65 kg/m   General: Well-nourished, well-developed in no acute distress.  Eyes: No icterus. Mouth: Oropharyngeal mucosa moist and pink , no lesions erythema or exudate. Lungs: Clear to auscultation bilaterally.  Heart: Regular rate and rhythm, no murmurs rubs or gallops.  Abdomen: Bowel  sounds are normal, nontender, nondistended, no hepatosplenomegaly or masses, no abdominal bruits or hernia , no rebound or guarding.   Extremities: No lower extremity edema. No clubbing or deformities. Neuro: Alert and oriented x 4   Skin: Warm and dry, no jaundice.   Psych: Alert and cooperative, normal mood and affect.  Labs:   Lab Results  Component Value Date   ALT 11 06/04/2018   AST 15 06/04/2018   ALKPHOS 82 05/23/2017   BILITOT 0.3 06/04/2018    Imaging Studies: No results found.

## 2018-12-05 NOTE — Telephone Encounter (Signed)
Pt said her maternal grandfather had throat/esophageal cancer. Her paternal grandfather had prostate cancer. She didn't think any family has had colon cancer, and doesn't know about the uncle listed. She said she does not believe that she has every had a colonoscopy.

## 2018-12-05 NOTE — Assessment & Plan Note (Addendum)
48 year old female with chronic GERD, gastritis who presents for routine follow-up.  Dexilant has shows significant improvement in the past however she states it never really completely controlled her heartburn and reflux.  Over the last few weeks however she has been having daily breakthrough heartburn, over the past several days she has had fairly constant burning central abdomen above the umbilicus.  SHe "spit" up this morning.  In the past has had frequent regurgitation/vomiting related to her reflux.  There is been no significant changes S4s medication or her medical history.  Her weight is up about 10 pounds.  Previously failed omeprazole.  Reinforced antireflux measures.  Recommended modest weight loss.  Stop Dexilant, try pantoprazole 40 mg 30 to 60 minutes before meals twice daily.  We provided her with Dexilant samples in case pantoprazole did not work and she wanted to go back to it.  She knows not to take both.  We will add Carafate 4 times daily for 10 days for symptomatic relief.  She will call in 2 weeks with a progress report.  We will also let me know after she goes back to ENT and completes her evaluation so that we could obtain records.

## 2018-12-05 NOTE — Telephone Encounter (Signed)
Please find out from patient what kind of cancer her paternal grandfather had.  Also confirm how many people of had colon cancer.family, we have paternal uncle listed.  Has she ever had a colonoscopy.

## 2018-12-06 ENCOUNTER — Encounter: Payer: Self-pay | Admitting: Gastroenterology

## 2018-12-06 NOTE — Telephone Encounter (Signed)
PT is aware.

## 2018-12-06 NOTE — Telephone Encounter (Signed)
OK, FH updated. Please let patient know.   Without any known FH of CRC, patient needs colonoscopy no later than age 48 although some insurance companies are covering at age 63 based on American Cancer Guidelines. She could check with her insurance company to find out when she can have her first screening colonoscopy.

## 2018-12-10 NOTE — Progress Notes (Signed)
CC'D TO PCP °

## 2019-06-05 ENCOUNTER — Ambulatory Visit: Payer: 59 | Admitting: Gastroenterology

## 2019-07-23 ENCOUNTER — Ambulatory Visit: Payer: 59 | Admitting: Gastroenterology

## 2019-08-27 ENCOUNTER — Ambulatory Visit: Payer: 59 | Admitting: Gastroenterology

## 2019-08-27 ENCOUNTER — Encounter: Payer: Self-pay | Admitting: Gastroenterology

## 2019-08-27 ENCOUNTER — Other Ambulatory Visit: Payer: Self-pay

## 2019-08-27 VITALS — BP 117/80 | HR 70 | Temp 96.6°F | Ht 62.0 in | Wt 173.0 lb

## 2019-08-27 DIAGNOSIS — K219 Gastro-esophageal reflux disease without esophagitis: Secondary | ICD-10-CM | POA: Diagnosis not present

## 2019-08-27 MED ORDER — DEXLANSOPRAZOLE 60 MG PO CPDR: 60.0000 mg | DELAYED_RELEASE_CAPSULE | Freq: Every day | ORAL | 3 refills | Status: DC

## 2019-08-27 MED ORDER — CLARITIN-D 24 HOUR 10-240 MG PO TB24: 1.0000 | ORAL_TABLET | Freq: Every day | ORAL | 3 refills | Status: DC

## 2019-08-27 NOTE — Progress Notes (Signed)
Referring Provider: Terald Sleeper, PA-C Primary Care Physician:  Terald Sleeper, PA-C Primary GI: Dr. Oneida Alar   Chief Complaint  Patient presents with  . Gastroesophageal Reflux    HPI:   Brooke Parsons is a 48 y.o. female presenting today with a history of GERD,gastritis, moderate sized hiatal hernia. Last EGD in 2018. Normal BPE in Dec 2018. Historically on Dexilant, failing omeprazole. Trial of Protonix at last visit. For persistent globus sensation she was referred to ENT in 2019. She had a transnasal flexible laryngoscopy in September showing laryngeal edema felt to be secondary to acid reflux.  According to ENT note there are plans for a modified barium swallow study, referral for sleep study, throat clearing techniques. She has been unable to complete due to COVID.   Liked Dexilant more than Protonix, although Protonix was cheaper. Wants to stay on Dexilant. No globus sensation for awhile. Spitting up in sleep occasionally. Sleeps on risers, no eating late. Took Claritin-D from a friend and noted much improvement in globus sensation and not as much phlegm. Feels this was helpful.  No overt GI bleeding. Initial screening colonoscopy due age 89.   Past Medical History:  Diagnosis Date  . Cervical cancer ([place])   . GERD (gastroesophageal reflux disease)   . Hyperlipidemia   . Irritable bowel syndrome (IBS)   . Thyroid disease     Past Surgical History:  Procedure Laterality Date  . ABDOMINAL HYSTERECTOMY    . CARPAL TUNNEL RELEASE    . ESOPHAGOGASTRODUODENOSCOPY  02/2012   Dr. Britta Mccreedy: schatzki's ring, hh, duodenal bx negative for celiac.   Marland Kitchen ESOPHAGOGASTRODUODENOSCOPY N/A 06/22/2017   Dr. Oneida Alar: mild gastritis due to Ibuprofen, moderate size hiatal hernia (path with reactive gastropathy/chemical gastritis) Negative H.pylori.   Marland Kitchen SAVORY DILATION N/A 06/22/2017   Procedure: SAVORY DILATION;  Surgeon: Danie Binder, MD;  Location: [facility];  Service: Endoscopy;   Laterality: N/A;  . TONSILLECTOMY      Current Outpatient Medications  Medication Sig Dispense Refill  . acetaminophen (TYLENOL) 500 MG tablet Take 500 mg by mouth daily as needed for moderate pain or headache.    . Hyoscyamine Sulfate SL (LEVSIN/SL) 0.125 MG SUBL Place 1 tablet under the tongue every 4 (four) hours as needed. For abdominal pain and loose stool 120 each 3  . levothyroxine (SYNTHROID) 75 MCG tablet Take 75 mcg by mouth at bedtime. Brand name only    . sertraline (ZOLOFT) 50 MG tablet Take 1 tablet by mouth daily.    Marland Kitchen dexlansoprazole (DEXILANT) 60 MG capsule Take 1 capsule (60 mg total) by mouth daily. 90 capsule 3  . loratadine-pseudoephedrine (CLARITIN-D 24 HOUR) 10-240 MG 24 hr tablet Take 1 tablet by mouth daily. 30 tablet 3   No current facility-administered medications for this visit.     Allergies as of 08/27/2019 - Review Complete 08/27/2019  Allergen Reaction Noted  . Morphine and related  05/20/2016  . Clindamycin/lincomycin Hives and Rash 05/20/2016  . Synthroid [levothyroxine] Palpitations 05/20/2016    Family History  Problem Relation Age of Onset  . Heart attack Mother   . Stroke Mother   . Heart attack Sister   . Heart attack Brother 68  . Heart attack Maternal Uncle   . Heart attack Maternal Grandfather   . Cancer Maternal Grandfather        throat/esophageal?  . Heart attack Father   . Cancer Paternal Grandfather        prostate  .  Cancer Paternal Uncle        patient states she doesn't really know but does not think it was colon cancer as previously listed.    Social History   Socioeconomic History  . Marital status: Married    Spouse name: Not on file  . Number of children: Not on file  . Years of education: Not on file  . Highest education level: Not on file  Occupational History  . Not on file  Social Needs  . Financial resource strain: Not on file  . Food insecurity    Worry: Not on file    Inability: Not on file  .  Transportation needs    Medical: Not on file    Non-medical: Not on file  Tobacco Use  . Smoking status: Current Every Day Smoker    Types: E-cigarettes    Last attempt to quit: 12/14/2014    Years since quitting: 4.7  . Smokeless tobacco: Never Used  . Tobacco comment: vapes  Substance and Sexual Activity  . Alcohol use: No  . Drug use: No  . Sexual activity: Not on file  Lifestyle  . Physical activity    Days per week: Not on file    Minutes per session: Not on file  . Stress: Not on file  Relationships  . Social Herbalist on phone: Not on file    Gets together: Not on file    Attends religious service: Not on file    Active member of club or organization: Not on file    Attends meetings of clubs or organizations: Not on file    Relationship status: Not on file  Other Topics Concern  . Not on file  Social History Narrative  . Not on file    Review of Systems: Gen: Denies fever, chills, anorexia. Denies fatigue, weakness, weight loss.  CV: Denies chest pain, palpitations, syncope, peripheral edema, and claudication. Resp: Denies dyspnea at rest, cough, wheezing, coughing up blood, and pleurisy. GI: see HPI Derm: Denies rash, itching, dry skin Psych: Denies depression, anxiety, memory loss, confusion. No homicidal or suicidal ideation.  Heme: Denies bruising, bleeding, and enlarged lymph nodes.  Physical Exam: BP 117/80   Pulse 70   Temp (!) 96.6 F (35.9 C) (Temporal)   Ht 5\' 2"  (1.575 m)   Wt 173 lb (78.5 kg)   BMI 31.64 kg/m  General:   Alert and oriented. No distress noted. Pleasant and cooperative.  Head:  Normocephalic and atraumatic. Eyes:  Conjuctiva clear without scleral icterus. Abdomen:  +BS, soft, non-tender and non-distended. No rebound or guarding. No HSM or masses noted. Msk:  Symmetrical without gross deformities. Normal posture. Extremities:  Without edema. Neurologic:  Alert and  oriented x4 Psych:  Alert and cooperative. Normal  mood and affect.

## 2019-08-27 NOTE — Progress Notes (Signed)
cc'ed to pcp °

## 2019-08-27 NOTE — Assessment & Plan Note (Signed)
48 year old female with chronic GERD, gastritis, globus sensation, evaluated by ENT in past. She has noted improvement in symptoms with Claritin and requesting prescription for this until she can get further from PCP. Still needs to return to ENT but due to COVID, this has been delayed. Dexilant has been best at controlling GERD symptoms. Refilled Dexilant and provided Claritin-D, with further through PCP. Return to see ENT when able to (COVID restrictions). Continue dietary/behavior modifications. May take Pepcid prn in evening. Return in 6-8 months. Patient requesting virtual at that time if still able due to [place].

## 2019-08-27 NOTE — Patient Instructions (Addendum)
I have refilled Dexilant.   You can take Pepcid as needed at bedtime.   I have sent in a prescription for Claritin-D. This is only once per 24 hours as needed. You can get further prescriptions from your PCP if needed.  We will see you in 6-8 months!  Please follow-up with ENT when you are able to do so.  I enjoyed seeing you again today! As you know, I value our relationship and want to provide genuine, compassionate, and quality care. I welcome your feedback. If you receive a survey regarding your visit,  I greatly appreciate you taking time to fill this out. See you next time!  Annitta Needs, PhD, ANP-BC [facility]

## 2020-02-24 NOTE — Progress Notes (Signed)
Primary Care Physician:  Terald Sleeper, PA-C  Primary GI: Dr. Oneida Alar   Patient Location: Home   Provider Location: [facility] office   Reason for Visit: Follow-up    Persons present on the virtual encounter, with roles: Patient and NP     Due to COVID-19, visit was conducted using virtual method.  Visit was requested by patient.  Virtual Visit via MyChart Video Note Due to COVID-19, visit is conducted virtually and was requested by patient.   I connected with Denna Haggard on 02/25/20 at  2:00 PM EDT by telephone and verified that I am speaking with the correct person using two identifiers.   I discussed the limitations, risks, security and privacy concerns of performing an evaluation and management service by telephone and the availability of in person appointments. I also discussed with the patient that there may be a patient responsible charge related to this service. The patient expressed understanding and agreed to proceed.  Chief Complaint  Patient presents with  . Gastroesophageal Reflux    off and on     History of Present Illness: Brooke Parsons is a 49 year old female presenting in follow-up today with history of GERD,gastritis, moderate sized hiatal hernia. Last EGD in 2018. Normal BPE in Dec 2018. Historically on Dexilant, failing omeprazole and has also tried pantoprazole. For persistent globus sensation she was referred to ENT in 2019.She had a transnasal flexible laryngoscopy in September showing laryngeal edema felt to be secondary to acid reflux. She has not followed up with them due to [place] and requesting an updated referral.   Dexilant works the best. Forgot which medication she could take at night.  Dysphagia much improved. Sinus issues like crazy. ENT visit still on hold due to [place]. Difficult to eat cereal, grits, milk as will have painful abdominal cramping. No overt GI bleeding.     Past Medical History:  Diagnosis Date  . Cervical cancer  ([place])   . GERD (gastroesophageal reflux disease)   . Hyperlipidemia   . Irritable bowel syndrome (IBS)   . Thyroid disease      Past Surgical History:  Procedure Laterality Date  . ABDOMINAL HYSTERECTOMY    . CARPAL TUNNEL RELEASE    . ESOPHAGOGASTRODUODENOSCOPY  02/2012   Dr. Britta Mccreedy: schatzki's ring, hh, duodenal bx negative for celiac.   Marland Kitchen ESOPHAGOGASTRODUODENOSCOPY N/A 06/22/2017   Dr. Oneida Alar: mild gastritis due to Ibuprofen, moderate size hiatal hernia (path with reactive gastropathy/chemical gastritis) Negative H.pylori.   Marland Kitchen SAVORY DILATION N/A 06/22/2017   Procedure: SAVORY DILATION;  Surgeon: Danie Binder, MD;  Location: [facility];  Service: Endoscopy;  Laterality: N/A;  . TONSILLECTOMY       Current Meds  Medication Sig  . acetaminophen (TYLENOL) 500 MG tablet Take 500 mg by mouth daily as needed for moderate pain or headache.  . dexlansoprazole (DEXILANT) 60 MG capsule Take 1 capsule (60 mg total) by mouth daily.  Marland Kitchen Hyoscyamine Sulfate SL (LEVSIN/SL) 0.125 MG SUBL Place 1 tablet under the tongue every 4 (four) hours as needed. For abdominal pain and loose stool  . levothyroxine (SYNTHROID) 75 MCG tablet Take 75 mcg by mouth at bedtime. Brand name only  . loratadine-pseudoephedrine (CLARITIN-D 24 HOUR) 10-240 MG 24 hr tablet Take 1 tablet by mouth daily. (Patient taking differently: Take 1 tablet by mouth as directed. )  . pravastatin (PRAVACHOL) 20 MG tablet Take 20 mg by mouth daily.     Family History  Problem Relation Age of Onset  . Heart attack Mother   . Stroke Mother   . Heart attack Sister   . Heart attack Brother 54  . Heart attack Maternal Uncle   . Heart attack Maternal Grandfather   . Cancer Maternal Grandfather        throat/esophageal?  . Heart attack Father   . Cancer Paternal Grandfather        prostate  . Cancer Paternal Uncle        patient states she doesn't really know but does not think it was colon cancer as previously listed.     Social History   Socioeconomic History  . Marital status: Married    Spouse name: Not on file  . Number of children: Not on file  . Years of education: Not on file  . Highest education level: Not on file  Occupational History  . Not on file  Tobacco Use  . Smoking status: Current Every Day Smoker    Types: E-cigarettes    Last attempt to quit: 12/14/2014    Years since quitting: 5.2  . Smokeless tobacco: Never Used  . Tobacco comment: vapes  Substance and Sexual Activity  . Alcohol use: No  . Drug use: No  . Sexual activity: Not on file  Other Topics Concern  . Not on file  Social History Narrative  . Not on file   Social Determinants of Health   Financial Resource Strain:   . Difficulty of Paying Living Expenses:   Food Insecurity:   . Worried About Charity fundraiser in the Last Year:   . Arboriculturist in the Last Year:   Transportation Needs:   . Film/video editor (Medical):   Marland Kitchen Lack of Transportation (Non-Medical):   Physical Activity:   . Days of Exercise per Week:   . Minutes of Exercise per Session:   Stress:   . Feeling of Stress :   Social Connections:   . Frequency of Communication with Friends and Family:   . Frequency of Social Gatherings with Friends and Family:   . Attends Religious Services:   . Active Member of Clubs or Organizations:   . Attends Archivist Meetings:   Marland Kitchen Marital Status:        Review of Systems: Gen: Denies fever, chills, anorexia. Denies fatigue, weakness, weight loss.  CV: Denies chest pain, palpitations, syncope, peripheral edema, and claudication. Resp: Denies dyspnea at rest, cough, wheezing, coughing up blood, and pleurisy. GI: see HPI Derm: Denies rash, itching, dry skin Psych: Denies depression, anxiety, memory loss, confusion. No homicidal or suicidal ideation.  Heme: Denies bruising, bleeding, and enlarged lymph nodes.  Observations/Objective: No distress. Maintains eye contact. Pleasant.    Assessment and Plan: 49 year old pleasant female with history of chronic GERD, gastritis, globus sensation and evaluated by ENT in 2019, returning today in follow-up.  GERD: best controlled with Dexilant. Continue this for now. Will facilitate visit back to ENT as well when patient feels comfortable. May take pepcid each evening as needed.   Initial screening colonoscopy at age 34 barring any clinical changes.  We will see her back in 1 year or sooner if needed.   Follow Up Instructions:    I discussed the assessment and treatment plan with the patient. The patient was provided an opportunity to ask questions and all were answered. The patient agreed with the plan and demonstrated an understanding of the instructions.   The patient was advised  to call back or seek an in-person evaluation if the symptoms worsen or if the condition fails to improve as anticipated.   Annitta Needs, PhD, ANP-BC [facility]

## 2020-02-25 ENCOUNTER — Telehealth (INDEPENDENT_AMBULATORY_CARE_PROVIDER_SITE_OTHER): Payer: 59 | Admitting: Gastroenterology

## 2020-02-25 ENCOUNTER — Other Ambulatory Visit: Payer: Self-pay

## 2020-02-25 ENCOUNTER — Other Ambulatory Visit: Payer: Self-pay | Admitting: *Deleted

## 2020-02-25 ENCOUNTER — Encounter: Payer: Self-pay | Admitting: Gastroenterology

## 2020-02-25 DIAGNOSIS — K449 Diaphragmatic hernia without obstruction or gangrene: Secondary | ICD-10-CM

## 2020-02-25 DIAGNOSIS — K219 Gastro-esophageal reflux disease without esophagitis: Secondary | ICD-10-CM

## 2020-02-25 DIAGNOSIS — F17290 Nicotine dependence, other tobacco product, uncomplicated: Secondary | ICD-10-CM

## 2020-02-25 DIAGNOSIS — K2970 Gastritis, unspecified, without bleeding: Secondary | ICD-10-CM

## 2020-02-25 MED ORDER — DEXLANSOPRAZOLE 60 MG PO CPDR: 60.0000 mg | DELAYED_RELEASE_CAPSULE | Freq: Every day | ORAL | 3 refills | Status: DC

## 2020-02-25 NOTE — Patient Instructions (Signed)
Continue Dexilant once daily. I refilled this for you.   You can take Pepcid as needed in the evenings. This is over-the-counter (generic name is famotidine).  We will get the ENT referral going again so you can follow-up when feasible.  We will see you in 1 year or sooner if needed!  I enjoyed seeing you again today! As you know, I value our relationship and want to provide genuine, compassionate, and quality care. I welcome your feedback. If you receive a survey regarding your visit,  I greatly appreciate you taking time to fill this out. See you next time!  Annitta Needs, PhD, ANP-BC [facility]

## 2020-03-11 ENCOUNTER — Telehealth: Payer: Self-pay

## 2020-03-11 NOTE — Telephone Encounter (Signed)
Called [facility] ENT to f/u on referral. Receptionist advised pt was scheduled for appt with Dr. Joya Gaskins 01/22/20 but she cancelled. She has cancelled her last 5 appts.

## 2020-04-21 ENCOUNTER — Telehealth: Payer: Self-pay | Admitting: Emergency Medicine

## 2020-04-21 NOTE — Telephone Encounter (Addendum)
Pt is on dexilant 60mg . However, [facility] [place] stated it is on back order and wants to know if they can fill protonix 40mg  until dexilant is available. Pt has called several pharmacies but  All of them are out of stock with dexilant and wants  Pt wants an rx fro protonix 40mg   to be sent into her pharmacy. She also wants to know if she is able to take pepcid with her protonix

## 2020-04-22 NOTE — Telephone Encounter (Signed)
Is there another pharmacy that we can send [place] to?

## 2020-04-23 NOTE — Telephone Encounter (Signed)
Called pt she stated she will call [facility] or another pharmacy to see if she can get the dexilant transferred so she can get it filled

## 2020-04-30 NOTE — Telephone Encounter (Signed)
Noted, let me know if any further issues

## 2020-05-01 ENCOUNTER — Other Ambulatory Visit: Payer: Self-pay | Admitting: Gastroenterology

## 2020-05-06 ENCOUNTER — Telehealth: Payer: Self-pay | Admitting: Gastroenterology

## 2020-05-06 NOTE — Telephone Encounter (Signed)
Rx for protonix was sent in on 6/11 by ll to pt pharmacy. Called pharmacy to verify. Rx is ready for pick up. Notified pt

## 2020-05-06 NOTE — Telephone Encounter (Signed)
Pt called asking for LL regarding her medication. [phone]

## 2021-01-20 ENCOUNTER — Encounter: Payer: Self-pay | Admitting: Internal Medicine

## 2021-03-29 ENCOUNTER — Other Ambulatory Visit: Payer: Self-pay | Admitting: Gastroenterology

## 2021-03-31 NOTE — Telephone Encounter (Signed)
We can give limited refills but I need to know if she is taking dexilant or pantoprazole.   Make ov for follow up.

## 2021-04-01 NOTE — Telephone Encounter (Signed)
Pt says she is currently taking Dexilant.  Has ov already scheduled for 04/07/2021 at 2:30.

## 2021-04-01 NOTE — Telephone Encounter (Signed)
Lmom for pt to call me back. 

## 2021-04-02 NOTE — Telephone Encounter (Signed)
rx for dexilant, #90 given

## 2021-04-06 NOTE — Progress Notes (Signed)
Primary Care Physician:  Terald Sleeper, PA-C  Primary GI: Dr. Abbey Chatters  Patient Location: Home   Provider Location: [facility] office   Reason for Visit:  1 year follow-up.   Persons present on the virtual encounter, with roles:  Aliene Altes, PA-C (provider), Brooke Parsons (patient)   Total time (minutes) spent on medical discussion: 12 minutes  Virtual Visit via Telephone Note Due to COVID-19, visit is conducted virtually and was requested by patient.   I connected with Brooke Parsons on 04/08/21 at  2:30 PM EDT by telephone and verified that I am speaking with the correct person using two identifiers.   I discussed the limitations, risks, security and privacy concerns of performing an evaluation and management service by telephone and the availability of in person appointments. I also discussed with the patient that there may be a patient responsible charge related to this service. The patient expressed understanding and agreed to proceed.  Chief Complaint  Patient presents with  . Gastroesophageal Reflux    Insurance will no longer cover dexilant. Needs alternative.     History of Present Illness: Brooke Parsons is a 50 year old female presenting in follow-up today with history of GERD,gastritis, moderate sized hiatal hernia, dysphagia, and IBS with loose stools. Last EGD August 2018 with mild gastritis due to ibuprofen, moderate size hiatal hernia. Normal BPE in Dec 2018. Historically on Dexilant, failing omeprazole and has also tried pantoprazole. For persistent globus sensation she was referred to [facility] ENTin 2019.She had a transnasal flexible laryngoscopy showing laryngeal edema felt to be secondary to acid reflux.   She was last seen via telemedicine visit 02/25/2000.  She reported Dexilant works best for acid reflux.  Dysphagia was much improved.  Ongoing sinus trouble.  ENT follow-up still on hold due to [place].  Reported painful abdominal cramping with cereal, grits, and  milk.  No overt GI bleeding.  Advised to continue her current medications and take Pepcid as needed in the evenings.  Follow-up with ENT when she feels comfortable, we placed an updated referral.  Follow-up with [place] in 1 year.  Patient called our office in June 2021 requesting prescription for Protonix as Dexilant was on backorder at her pharmacy.  Today: GERD is well controlled on Dexilant 60 mg daily.  However, she received a letter in the mail that [place] is no longer covered by her insurance.  She has previously been on omeprazole, pantoprazole, and Pepcid.  Denies ever trying Nexium.  Denies nausea or vomiting.  Rare dysphagia.  Does not feel this occurs frequent enough to require further evaluation.  Prefers to continue to monitor.  Intermittent discomfort/bloating/cramping.  This can be influenced by dairy or brought on prior to bowel movements and improves thereafter.  1-3 loose stools daily.  This is chronic.  Has history of IBS.  Remembers taking Levsin in the past which worked very well for her, but she has not had this medication in quite some time.  She would like to try this again so that she can go out to eat.  States now she has to take her food home with her as eating out in public is embarrassing.  Denies BRBPR or melena.  Past Medical History:  Diagnosis Date  . Cervical cancer ([place])   . GERD (gastroesophageal reflux disease)   . Hyperlipidemia   . Irritable bowel syndrome (IBS)   . Thyroid disease      Past Surgical History:  Procedure Laterality Date  . ABDOMINAL HYSTERECTOMY    .  CARPAL TUNNEL RELEASE    . ESOPHAGOGASTRODUODENOSCOPY  02/2012   Dr. Britta Mccreedy: schatzki's ring, hh, duodenal bx negative for celiac.   Marland Kitchen ESOPHAGOGASTRODUODENOSCOPY N/A 06/22/2017   Dr. Oneida Alar: mild gastritis due to Ibuprofen, moderate size hiatal hernia (path with reactive gastropathy/chemical gastritis) Negative H.pylori.   Marland Kitchen SAVORY DILATION N/A 06/22/2017   Procedure: SAVORY DILATION;  Surgeon:  Danie Binder, MD;  [facility]: [facility];  Service: Endoscopy;  Laterality: N/A;  . TONSILLECTOMY       Current Meds  Medication Sig  . acetaminophen (TYLENOL) 500 MG tablet Take 500 mg by mouth daily as needed for moderate pain or headache.  . esomeprazole (NEXIUM) 40 MG capsule Take 1 capsule (40 mg total) by mouth daily before breakfast.  . levothyroxine (SYNTHROID) 75 MCG tablet Take 75 mcg by mouth at bedtime. Brand name only  . loratadine-pseudoephedrine (CLARITIN-D 24 HOUR) 10-240 MG 24 hr tablet Take 1 tablet by mouth daily. (Patient taking differently: Take 1 tablet by mouth as directed. As needed)  . [DISCONTINUED] DEXILANT 60 MG capsule TAKE (1) CAPSULE BY MOUTH IN THE MORNING WITH FIRST MEAL.     Family History  Problem Relation Age of Onset  . Heart attack Mother   . Stroke Mother   . Heart attack Sister   . Heart attack Brother 43  . Heart attack Maternal Uncle   . Heart attack Maternal Grandfather   . Cancer Maternal Grandfather        throat/esophageal?  . Heart attack Father   . Cancer Paternal Grandfather        prostate  . Cancer Paternal Uncle        patient states she doesn't really know but does not think it was colon cancer as previously listed.  . Colon cancer Neg Hx     Social History   Socioeconomic History  . Marital status: Married    Spouse name: Not on file  . Number of children: Not on file  . Years of education: Not on file  . Highest education level: Not on file  Occupational History  . Not on file  Tobacco Use  . Smoking status: Current Every Day Smoker    Types: E-cigarettes    Last attempt to quit: 12/14/2014    Years since quitting: 6.3  . Smokeless tobacco: Never Used  . Tobacco comment: vapes  Substance and Sexual Activity  . Alcohol use: No  . Drug use: No  . Sexual activity: Not on file  Other Topics Concern  . Not on file  Social History Narrative  . Not on file   Social Determinants of Health   Financial  Resource Strain: Not on file  Food Insecurity: Not on file  Transportation Needs: Not on file  Physical Activity: Not on file  Stress: Not on file  Social Connections: Not on file     Review of Systems: Gen: Denies fever, chills, cold or flulike symptoms, lightheadedness, dizziness, presyncope, syncope. CV: Denies chest pain or palpitations. Resp: Denies dyspnea or cough. GI: see HPI Heme: See HPI  Observations/Objective: No distress. Alert and oriented. Pleasant. Unable to perform complete physical exam due to telephone encounter. No video available.    Assessment: 50 year old female presenting in follow-up today withhistory of GERD,gastritis, moderate sized hiatal hernia, dysphagia, and IBS with loose stools.  GERD: Well-controlled on Dexilant 60 mg daily, but insurance is no longer covering this.  Previously failed omeprazole and pantoprazole.  We will try her on Nexium 40  mg daily.  Dysphagia: Fairly rare.  Patient does not feel this needs any further evaluation.  IBS with loose stools: Chronic.  1-3 loose bowel movements daily.  Associated abdominal cramping prior to BMs that improves thereafter.  No alarm symptoms. On Levsin years ago which worked well for her and she would like to try this again. Along with IBS, I suspect lactose intolerance likely influencing symptoms.  She was advised to follow a lactose-free diet.  Prescription for Levsin as needed before meals and at bedtime sent to pharmacy.  Plan: 1.  Stop Dexilant and start Nexium 40 mg daily 30 minutes before breakfast. 2.  Levsin 0.125 mg sublingual up to 3 times daily before meals and at bedtime.  Hold in the setting of constipation. 3.  Follow lactose-free diet or take Lactaid tablets prior to consuming dairy products.  Lactose-free handout provided. 4.  Follow-up in 6 months or sooner if needed.  Follow Up Instructions: Follow-up in 6 months or sooner if needed.   I discussed the assessment and treatment plan  with the patient. The patient was provided an opportunity to ask questions and all were answered. The patient agreed with the plan and demonstrated an understanding of the instructions.   The patient was advised to call back or seek an in-person evaluation if the symptoms worsen or if the condition fails to improve as anticipated.  I provided 12 minutes of non-face-to-face time during this encounter.  Aliene Altes, PA-C [facility]  04/07/2021

## 2021-04-07 ENCOUNTER — Ambulatory Visit (INDEPENDENT_AMBULATORY_CARE_PROVIDER_SITE_OTHER): Payer: 59 | Admitting: Gastroenterology

## 2021-04-07 ENCOUNTER — Telehealth: Payer: Self-pay | Admitting: *Deleted

## 2021-04-07 ENCOUNTER — Encounter: Payer: Self-pay | Admitting: Gastroenterology

## 2021-04-07 DIAGNOSIS — K219 Gastro-esophageal reflux disease without esophagitis: Secondary | ICD-10-CM | POA: Diagnosis not present

## 2021-04-07 DIAGNOSIS — K580 Irritable bowel syndrome with diarrhea: Secondary | ICD-10-CM | POA: Diagnosis not present

## 2021-04-07 MED ORDER — ESOMEPRAZOLE MAGNESIUM 40 MG PO CPDR: 40.0000 mg | DELAYED_RELEASE_CAPSULE | Freq: Every day | ORAL | 3 refills | Status: DC

## 2021-04-07 MED ORDER — HYOSCYAMINE SULFATE SL 0.125 MG SL SUBL: 1.0000 | SUBLINGUAL_TABLET | Freq: Three times a day (TID) | SUBLINGUAL | 3 refills | Status: DC

## 2021-04-07 NOTE — Patient Instructions (Addendum)
As your insurance is no longer covering [place], we will try you on Nexium 40 mg daily.  Take this medication 30 minutes before breakfast.  I have sent a prescription to your pharmacy.  To help with abdominal cramping and loose stools, use Levsin 0.125 mg under your tongue up to 3 times daily before meals and at bedtime.  Hold in the setting of constipation.  Follow a lactose-free diet or take Lactaid tablets prior to consuming dairy products.  We will plan to see back in 6 months.  Do not hesitate to call if you have questions or concerns prior.  Aliene Altes, PA-C [facility]   Lactose-Free Diet, Adult If you have lactose intolerance, you are not able to digest lactose. Lactose is a natural sugar found mainly in dairy milk and dairy products. You may need to avoid all foods and beverages that contain lactose. A lactose-free diet can help you do this. Which foods have lactose? Lactose is found in dairy milk and dairy products, such as:  Yogurt.  Cheese.  Butter.  Margarine.  Sour cream.  Cream.  Whipped toppings and nondairy creamers.  Ice cream and other dairy-based desserts. Lactose is also found in foods or products made with dairy milk or milk ingredients. To find out whether a food contains dairy milk or a milk ingredient, look at the ingredients list. Avoid foods with the statement "May contain milk" and foods that contain:  Milk powder.  Whey.  Curd.  Caseinate.  Lactose.  Lactalbumin.  Lactoglobulin. What are alternatives to dairy milk and foods made with milk products?  Lactose-free milk.  Soy milk with added calcium and vitamin D.  Almond milk, coconut milk, rice milk, or other nondairy milk alternatives with added calcium and vitamin D. Note that these are low in protein.  Soy products, such as soy yogurt, soy cheese, soy ice cream, and soy-based sour cream.  Other nut milk products, such as almond yogurt, almond cheese, cashew  yogurt, cashew cheese, cashew ice cream, coconut yogurt, and coconut ice cream. What are tips for following this plan?  Do not consume foods, beverages, vitamins, minerals, or medicines containing lactose. Read ingredient lists carefully.  Look for the words "lactose-free" on labels.  Use lactase enzyme drops or tablets as directed by your health care provider.  Use lactose-free milk or a milk alternative, such as soy milk or almond milk, for drinking and cooking.  Make sure you get enough calcium and vitamin D in your diet. A lactose-free eating plan can be lacking in these important nutrients.  Take calcium and vitamin D supplements as directed by your health care provider. Talk to your health care provider about supplements if you are not able to get enough calcium and vitamin D from food. What foods can I eat? Fruits All fresh, canned, frozen, or dried fruits that are not processed with lactose. Vegetables All fresh, frozen, and canned vegetables without cheese, cream, or butter sauces. Grains Any that are not made with dairy milk or dairy products. Meats and other proteins Any meat, fish, poultry, and other protein sources that are not made with dairy milk or dairy products. Soy cheese and yogurt. Fats and oils Any that are not made with dairy milk or dairy products. Beverages Lactose-free milk. Soy, rice, or almond milk with added calcium and vitamin D. Fruit and vegetable juices. Sweets and desserts Any that are not made with dairy milk or dairy products. Seasonings and condiments Any that are not made with  dairy milk or dairy products. Calcium Calcium is found in many foods that contain lactose and is important for bone health. The amount of calcium you need depends on your age:  Adults younger than 50 years: 1,000 mg of calcium a day.  Adults older than 50 years: 1,200 mg of calcium a day. If you are not getting enough calcium, you may get it from other sources,  including:  Orange juice with calcium added. There are 300-350 mg of calcium in 1 cup of orange juice.  Calcium-fortified soy milk. There are 300-400 mg of calcium in 1 cup of calcium-fortified soy milk.  Calcium-fortified rice or almond milk. There are 300 mg of calcium in 1 cup of calcium-fortified rice or almond milk.  Calcium-fortified breakfast cereals. There are 100-1,000 mg of calcium in calcium-fortified breakfast cereals.  Spinach, cooked. There are 145 mg of calcium in  cup of cooked spinach.  Edamame, cooked. There are 130 mg of calcium in  cup of cooked edamame.  Collard greens, cooked. There are 125 mg of calcium in  cup of cooked collard greens.  Kale, frozen or cooked. There are 90 mg of calcium in  cup of cooked or frozen kale.  Almonds. There are 95 mg of calcium in  cup of almonds.  Broccoli, cooked. There are 60 mg of calcium in 1 cup of cooked broccoli. The items listed above may not be a complete list of recommended foods and beverages. Contact a dietitian for more options.   What foods are not recommended? Fruits None, unless they are made with dairy milk or dairy products. Vegetables None, unless they are made with dairy milk or dairy products. Grains Any grains that are made with dairy milk or dairy products. Meats and other proteins None, unless they are made with dairy milk or dairy products. Dairy All dairy products, including milk, goat's milk, buttermilk, kefir, acidophilus milk, flavored milk, evaporated milk, condensed milk, dulce de [place], eggnog, yogurt, cheese, and cheese spreads. Fats and oils Any that are made with milk or milk products. Margarines and salad dressings that contain milk or cheese. Cream. Half and half. Cream cheese. Sour cream. Chip dips made with sour cream or yogurt. Beverages Hot chocolate. Cocoa with lactose. Instant iced teas. Powdered fruit drinks. Smoothies made with dairy milk or yogurt. Sweets and desserts Any  that are made with milk or milk products. Seasonings and condiments Chewing gum that has lactose. Spice blends if they contain lactose. Artificial sweeteners that contain lactose. Nondairy creamers. The items listed above may not be a complete list of foods and beverages to avoid. Contact a dietitian for more information. Summary  If you are lactose intolerant, it means that you have a hard time digesting lactose, a natural sugar found in milk and milk products.  Following a lactose-free diet can help you manage this condition.  Calcium is important for bone health and is found in many foods that contain lactose. Talk with your health care provider about other sources of calcium. This information is not intended to replace advice given to you by your health care provider. Make sure you discuss any questions you have with your health care provider. Document Revised: 12/05/2017 Document Reviewed: 12/05/2017 Elsevier Patient Education  2021 [employer].

## 2021-04-07 NOTE — Telephone Encounter (Signed)
Pt consented to a telephone visit. °

## 2021-04-07 NOTE — Telephone Encounter (Signed)
Brooke Parsons, you are scheduled for a virtual visit with your provider today.  Just as we do with appointments in the office, we must obtain your consent to participate.  Your consent will be active for this visit and any virtual visit you may have with one of our providers in the next 365 days.  If you have a MyChart account, I can also send a copy of this consent to you electronically.  All virtual visits are billed to your insurance company just like a traditional visit in the office.  As this is a virtual visit, video technology does not allow for your provider to perform a traditional examination.  This may limit your provider's ability to fully assess your condition.  If your provider identifies any concerns that need to be evaluated in person or the need to arrange testing such as labs, EKG, etc, we will make arrangements to do so.  Although advances in technology are sophisticated, we cannot ensure that it will always work on either your end or our end.  If the connection with a video visit is poor, we may have to switch to a telephone visit.  With either a video or telephone visit, we are not always able to ensure that we have a secure connection.   I need to obtain your verbal consent now.   Are you willing to proceed with your visit today?

## 2021-04-08 ENCOUNTER — Encounter: Payer: Self-pay | Admitting: Gastroenterology

## 2021-08-27 ENCOUNTER — Other Ambulatory Visit: Payer: Self-pay | Admitting: Gastroenterology

## 2021-08-27 DIAGNOSIS — K219 Gastro-esophageal reflux disease without esophagitis: Secondary | ICD-10-CM

## 2021-10-07 NOTE — Progress Notes (Signed)
Primary Care Physician:  Terald Sleeper, PA-C  Primary GI: Dr. Abbey Chatters  Patient Location: Home   Provider Location: [facility] office   Reason for Visit: Follow-up of GERD and IBS-D   Persons present on the virtual encounter, with roles: Aliene Altes, PA-C (provider), Brooke Parsons (patient)   Total time (minutes) spent on medical discussion: 22 minutes  Virtual Visit via Telephone Note Due to COVID-19, visit is conducted virtually and was requested by patient.   I connected with Brooke Parsons on 10/08/21 at 10:00 AM EST by telephone and verified that I am speaking with the correct person using two identifiers.   I discussed the limitations, risks, security and privacy concerns of performing an evaluation and management service by telephone and the availability of in person appointments. I also discussed with the patient that there may be a patient responsible charge related to this service. The patient expressed understanding and agreed to proceed.  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 Out of Levsin, it didn't help much   Diarrhea    About the same     History of Present Illness: Brooke Parsons is a 50 year old female presenting in follow-up of GERD and IBS.  She has a history of GERD, gastritis, moderate sized hiatal hernia, dysphagia, and IBS with loose stools. Last EGD August 2018 with mild gastritis due to ibuprofen, moderate size hiatal hernia. Normal BPE in Dec 2018. Historically on Dexilant, failing omeprazole and pantoprazole. For persistent globus sensation she was referred to [facility]NT in 2019. She had a transnasal flexible laryngoscopy showing laryngeal edema felt to be secondary to acid reflux.   Last seen via telephone visit on 04/07/21.  Reported GERD was well controlled on Dexilant 60 mg daily, but reported her insurance was no longer going to cover [place].  She had previously been on omeprazole,  pantoprazole, and Pepcid.  Reported rare dysphagia, but not frequent enough to warrant further evaluation.  Continued with intermittent abdominal discomfort/bloating/cramping which can occur prior to bowel movements and improved thereafter or be influenced by dairy.  In general, 1-3 loose stools daily.  Had been on Levsin previously and was requesting to try this again.  No alarm symptoms.  Plan to try Nexium 40 mg daily, start Levsin 0.125 mg sublingual up to 3 times daily before meals and at bedtime, lactose-free diet, follow-up in 6 months or sooner if needed.  Today:  GERD: Daily. Burning in her esophagus. Taking Nexium 40 mg daily. Dexilant worked much better, but insurance will not cover this. Feels dysphagia is starting to return, more with pills. Occasional dysphagia with sandwiches. Doesn't want to have EGD at this time. Occasional nausea without vomiting related to reflux.   IBS: Tried taking Levsin- daily every other day. Didn't feel this helped any. Diarrhea is several days a week. 1-3 Bms, postprandial, occasional cramping/bloating related to bowel movements.  Going out to eat is the worst. At home, not as bad. Couple days a week, she has firm stools. No blood in the stools or black stools. Didn't try lactose free diet but does not dairy can worsen loose stools.  No nocturnal stools.   Weight is stable.  No NSAIDs.   Past Medical History:  Diagnosis Date   Cervical cancer ([place])    GERD (gastroesophageal reflux disease)    Hyperlipidemia    Irritable bowel syndrome (IBS)    Thyroid disease  Past Surgical History:  Procedure Laterality Date   ABDOMINAL HYSTERECTOMY     CARPAL TUNNEL RELEASE     ESOPHAGOGASTRODUODENOSCOPY  02/2012   Dr. Britta Mccreedy: schatzki's ring, hh, duodenal bx negative for celiac.    ESOPHAGOGASTRODUODENOSCOPY N/A 06/22/2017   Dr. Oneida Alar: mild gastritis due to Ibuprofen, moderate size hiatal hernia (path with reactive gastropathy/chemical gastritis)  Negative H.pylori. Esophageal biopsies with reflux changes.   SAVORY DILATION N/A 06/22/2017   Procedure: SAVORY DILATION;  Surgeon: Danie Binder, MD;  Location: [facility] SUITE;  Service: Endoscopy;  Laterality: N/A;   TONSILLECTOMY       Current Meds  Medication Sig   acetaminophen (TYLENOL) 500 MG tablet Take 500 mg by mouth daily as needed for moderate pain or headache.   esomeprazole (NEXIUM) 40 MG capsule Take 1 capsule (40 mg total) by mouth 2 (two) times daily before a meal.   levothyroxine (SYNTHROID) 75 MCG tablet Take 75 mcg by mouth at bedtime. Brand name only   rifaximin (XIFAXAN) 550 MG TABS tablet Take 1 tablet (550 mg total) by mouth 3 (three) times daily.   [DISCONTINUED] esomeprazole (NEXIUM) 40 MG capsule TAKE ONE CAPSULE ONCE DAILY BEFORE BREAKFAST     Family History  Problem Relation Age of Onset   Heart attack Mother    Stroke Mother    Heart attack Father    Prostate cancer Father    Heart attack Sister    Heart attack Brother 27   Heart attack Maternal Grandfather    Cancer Maternal Grandfather        throat/esophageal?   Cancer Paternal Grandfather        prostate   Heart attack Maternal Uncle    Cancer Paternal Uncle        patient states she doesn't really know but does not think it was colon cancer as previously listed.   Colon cancer Neg Hx     Social History   Socioeconomic History   Marital status: Married    Spouse name: Not on file   Number of children: Not on file   Years of education: Not on file   Highest education level: Not on file  Occupational History   Not on file  Tobacco Use   Smoking status: Every Day    Types: E-cigarettes    Last attempt to quit: 12/14/2014    Years since quitting: 6.8   Smokeless tobacco: Never   Tobacco comments:    vapes  Substance and Sexual Activity   Alcohol use: No   Drug use: No   Sexual activity: Not on file  Other Topics Concern   Not on file  Social History Narrative   Not on file    Social Determinants of Health   Financial Resource Strain: Not on file  Food Insecurity: Not on file  Transportation Needs: Not on file  Physical Activity: Not on file  Stress: Not on file  Social Connections: Not on file      Review of Systems: Gen: Denies fever, chills, cold or flulike symptoms, presyncope, syncope. CV: Denies chest pain, palpitations. Resp: Denies dyspnea or cough. GI: see HPI Heme: See HPI  Observations/Objective: No distress. Alert and oriented. Pleasant. Unable to perform complete physical exam due to telephone encounter. No video available.    Assessment: 50 year old female presenting today for follow-up of GERD and IBS-D.  GERD:  Uncontrolled on Nexium 40 mg daily. Previously well controlled on Dexilant, but insurance is no longer covering.  Previously tried  and failed omeprazole and pantoprazole. Notably, patient reports some return of intermittent pill and solid food dysphagia.  She does have history of Schatzki's ring in 2013.  Offered EGD, but patient prefers to hold off on this.   We will try increasing Nexium to 40 mg twice daily.  IBS-D:  Chronic.  1-3 postprandial loose stools with associated abdominal bloating/cramping intermittently.  Suspect she may have a component of lactose intolerance in addition to IBS-D.  No alarm symptoms.  Reports Bentyl and Levsin have not been helpful.  We will try a course of Xifaxan.  Colon cancer screening: Due for first-ever screening colonoscopy.  No alarm symptoms.  No family history of colon cancer.  This will also help to evaluate for other etiologies of diarrhea.   Plan: Colonoscopy with propofol with Dr. Abbey Chatters in the near future. The risks, benefits, and alternatives have been discussed with the patient in detail. The patient states understanding and desires to proceed. ASA 2 Increase Nexium to 40 mg twice daily 30 minutes before breakfast and dinner.  New prescription sent to pharmacy. Reinforced  GERD diet/lifestyle.  Separate written instructions provided. Stop Levsin Xifaxan 550 mg 3 times daily x14 days for IBS-D. Follow a lactose-free diet for at least 2 weeks.  Advised to take Lactaid tablets if she may inadvertently consume dairy. Follow-up in 3-4 months.  Advised to call in 4 weeks if increasing Nexium is not improving her reflux symptoms.   Follow Up Instructions: Follow-up in 3-4 months.   I discussed the assessment and treatment plan with the patient. The patient was provided an opportunity to ask questions and all were answered. The patient agreed with the plan and demonstrated an understanding of the instructions.   The patient was advised to call back or seek an in-person evaluation if the symptoms worsen or if the condition fails to improve as anticipated.  I provided 22 minutes of non-face-to-face time during this encounter.  Aliene Altes, PA-C [facility]

## 2021-10-08 ENCOUNTER — Ambulatory Visit (INDEPENDENT_AMBULATORY_CARE_PROVIDER_SITE_OTHER): Payer: 59 | Admitting: Gastroenterology

## 2021-10-08 ENCOUNTER — Other Ambulatory Visit: Payer: Self-pay

## 2021-10-08 ENCOUNTER — Encounter: Payer: Self-pay | Admitting: Gastroenterology

## 2021-10-08 DIAGNOSIS — K219 Gastro-esophageal reflux disease without esophagitis: Secondary | ICD-10-CM

## 2021-10-08 DIAGNOSIS — Z1211 Encounter for screening for malignant neoplasm of colon: Secondary | ICD-10-CM

## 2021-10-08 DIAGNOSIS — K580 Irritable bowel syndrome with diarrhea: Secondary | ICD-10-CM

## 2021-10-08 MED ORDER — ESOMEPRAZOLE MAGNESIUM 40 MG PO CPDR: 40.0000 mg | DELAYED_RELEASE_CAPSULE | Freq: Two times a day (BID) | ORAL | 3 refills | Status: DC

## 2021-10-08 MED ORDER — RIFAXIMIN 550 MG PO TABS: 550.0000 mg | ORAL_TABLET | Freq: Three times a day (TID) | ORAL | 0 refills | Status: DC

## 2021-10-08 NOTE — Patient Instructions (Addendum)
For reflux: Increase Nexium to 40 mg twice daily 30 minutes before breakfast and 30 minutes before supper. Follow a GERD diet:  Avoid fried, fatty, greasy, spicy, citrus foods. Avoid caffeine and carbonated beverages. Avoid chocolate. Try eating 4-6 small meals a day rather than 3 large meals. Do not eat within 3 hours of laying down. Prop head of bed up on wood or bricks to create a 6 inch incline.  For irritable bowel syndrome with diarrhea: Stop Levsin. Take Xifaxan 550 mg 3 times a day x14 days.  I have sent a prescription to your pharmacy.  Please let me know if you have any trouble getting this medication. I also recommend you try a lactose-free diet at least for 2 weeks as you may also have lactose intolerance.  If you may inadvertently consume dairy during this 2 weeks, please take Lactaid tablets prior.  For colon cancer screening: We will get you scheduled for a colonoscopy in the near future with Dr. Abbey Chatters.   It was great to talk to you again today!  We will plan to follow-up with you in 3-4 months. Please call if 4 weeks if you Nexium is not helping with your reflux symptoms.   Aliene Altes, PA-C [facility]

## 2021-10-12 ENCOUNTER — Telehealth: Payer: Self-pay | Admitting: *Deleted

## 2021-10-12 ENCOUNTER — Telehealth: Payer: Self-pay

## 2021-10-12 NOTE — Telephone Encounter (Signed)
PA done for Xifaxan 550 mg done on Cover My Meds. Dx: IBS-D. Tried and failed: Levsin

## 2021-10-12 NOTE — Telephone Encounter (Signed)
Called pt to schedule TCS with propofol, Dr. Abbey Chatters asa 2. She wants to wait until January to have done. Advised we do not have schedule yet but will call once we do.

## 2021-10-13 NOTE — Telephone Encounter (Signed)
Spoke with patient.  She prefers not to try Viberzi as her symptoms are not severe.  I have recommended following a lactose-free diet, trial of IBgard, and can also try adding a daily probiotic to see if any of this will provide her with some additional benefit.  Additionally, if she plans to go out to eat which is when she notices most of her diarrhea/urgency, she could also try taking Imodium before going out.  She is satisfied with this plan.  She did request that I sent her a MyChart message regarding these recommendations as well.

## 2021-10-13 NOTE — Telephone Encounter (Signed)
Brooke Parsons, The pt was denied for Xifaxan tab. The pt 's reasoning has been highlighted in yellow and is on your desk. Please advise the next step.

## 2021-10-13 NOTE — Telephone Encounter (Signed)
Reviewed.  Insurance will only cover if patient has failed or cannot use a TCA, failed or cannot use Viberzi, or has potential for substance abuse disorder.  She does not meet any of these criteria.  I tried calling her to discuss options and left message requesting return call.

## 2021-11-04 MED ORDER — PEG 3350-KCL-NA BICARB-NACL 420 G PO SOLR: ORAL | 0 refills | Status: DC

## 2021-11-04 NOTE — Telephone Encounter (Signed)
Called pt. She has been scheduled for 1/10 at 11:00am. Aware will mail prep instructions and send rx to pharmacy. Confirmed she will have same insurance next year

## 2021-11-04 NOTE — Addendum Note (Signed)
Addended by: Cheron Every on: 11/04/2021 10:53 AM   Modules accepted: Orders

## 2021-11-23 ENCOUNTER — Telehealth: Payer: Self-pay | Admitting: *Deleted

## 2021-11-23 NOTE — Telephone Encounter (Signed)
PA approved via [facility]. Auth# [id number], DOS: Nov 30, 2021 - Feb 28, 2022

## 2021-11-25 ENCOUNTER — Telehealth: Payer: Self-pay | Admitting: Internal Medicine

## 2021-11-25 NOTE — Telephone Encounter (Signed)
Tried to call pt, LMOVM to inform pt procedure will be cancelled for 11/30/21. Advised her to call office if she decides to do TCS. Endo scheduler informed to cancel TCS.

## 2021-11-25 NOTE — Telephone Encounter (Signed)
Noted  

## 2021-11-25 NOTE — Telephone Encounter (Signed)
Patient wants to cancel procedure

## 2021-11-30 ENCOUNTER — Encounter (HOSPITAL_COMMUNITY): Admission: RE | Payer: Self-pay | Source: Home / Self Care

## 2021-11-30 ENCOUNTER — Ambulatory Visit (HOSPITAL_COMMUNITY): Admission: RE | Admit: 2021-11-30 | Payer: 59 | Source: Home / Self Care

## 2021-11-30 SURGERY — COLONOSCOPY WITH PROPOFOL
Anesthesia: Monitor Anesthesia Care

## 2022-02-01 ENCOUNTER — Encounter: Payer: Self-pay | Admitting: Internal Medicine

## 2022-06-19 ENCOUNTER — Other Ambulatory Visit: Payer: Self-pay | Admitting: Gastroenterology

## 2023-01-09 ENCOUNTER — Other Ambulatory Visit: Payer: Self-pay | Admitting: Gastroenterology

## 2023-06-06 ENCOUNTER — Other Ambulatory Visit: Payer: Self-pay | Admitting: Gastroenterology

## 2023-06-06 NOTE — Telephone Encounter (Signed)
I will send in limited refill supply.  Needs office visit for additional refills.

## 2023-06-19 NOTE — Progress Notes (Signed)
Referring Provider: Remus Loffler, PA-C Primary Care Physician:  Remus Loffler, PA-C Primary GI Physician: Dr. Marletta Lor  Chief Complaint  Patient presents with   Follow-up    Pt here for refills    HPI:   Brooke Parsons is a 52 y.o. female presenting today for follow-up of GERD and IBS.   She has a history of GERD, gastritis, moderate sized hiatal hernia, dysphagia, and IBS with loose stools. Last EGD August 2018 with mild gastritis due to ibuprofen, moderate size hiatal hernia. Normal BPE in Dec 2018. Historically on Dexilant, failing omeprazole and pantoprazole, but Dexilant switched to Nexium for insurance purposes in 2022. For persistent globus sensation she was referred to [facility] ENT in 2019. She had a transnasal flexible laryngoscopy showing laryngeal edema felt to be secondary to acid reflux.    Last seen in the office 10/08/2021.  Reported daily GERD symptoms taking Nexium 40 mg daily.  Dexilant worked much better, but insurance would not cover this.  Felt dysphagia starting to return, more with pills, but did not want to pursue EGD.  Occasional nausea related to reflux.  Continued with diarrhea several days a week, having 1-3 postprandial BMs with intermittent cramping/bloating.  Symptoms worsened by going out to eat.  No alarm symptoms.  Did not feel that Levsin or Bentyl in the past was helpful.  She had not tried lactose-free diet.  Plan included increasing Nexium to 40 mg twice daily, Xifaxan for IBS, lactose-free diet, and scheduled first-ever screening colonoscopy.  Ultimately, Xifaxan was not covered by insurance.  She had to fail or have contraindication to TCA or Viberzi.  Ultimately, patient was not interested in trying either of these medications as her symptoms were not severe.  Recommended lactose-free diet, trial of IBgard, adding daily probiotic.  Also advised that she could use Imodium before going out as this is when her symptoms seem to flare the most.  She was  satisfied with this plan.  Patient canceled her colonoscopy that was scheduled in January 2023.  Today:  GERD: Heartburn couple times a week.  Symptoms do not occur at any particular time of the day.  She does note that tomato based products will worsen her symptoms, but still has symptoms with avoiding this. Taking Nexium BID. Has been on pantoprazole in the past.  Dexilant worked the best for her, but insurance is no longer covering this.  Dysphagia to foods and pills a few times a week. Occasional regurgitation. Not interested in further work-up at this time. Reports last dilation was helpful, however.   She has been following a Mediterranean diet for the last several months and has had significant improvement in her IBS symptoms.  She is now having 1 bowel movements daily.  No BRBPR or melena.  No significant abdominal pain.  Still wants to hold off on colonoscopy.  States she has a lot of trouble with vertigo and is putting off everything for right now.   Past Medical History:  Diagnosis Date   Cervical cancer (HCC)    GERD (gastroesophageal reflux disease)    Hyperlipidemia    Irritable bowel syndrome (IBS)    Thyroid disease     Past Surgical History:  Procedure Laterality Date   ABDOMINAL HYSTERECTOMY     CARPAL TUNNEL RELEASE     ESOPHAGOGASTRODUODENOSCOPY  02/2012   Dr. Teena Dunk: schatzki's ring, hh, duodenal bx negative for celiac.    ESOPHAGOGASTRODUODENOSCOPY N/A 06/22/2017   Dr. Darrick Penna: mild gastritis due  to Ibuprofen, moderate size hiatal hernia (path with reactive gastropathy/chemical gastritis) Negative H.pylori. Esophageal biopsies with reflux changes.   SAVORY DILATION N/A 06/22/2017   Procedure: SAVORY DILATION;  Surgeon: West Bali, MD;  [facility];  Service: Endoscopy;  Laterality: N/A;   TONSILLECTOMY      Current Outpatient Medications  Medication Sig Dispense Refill   acetaminophen (TYLENOL) 500 MG tablet Take 500 mg by mouth daily as  needed for moderate pain or headache.     RABEprazole (ACIPHEX) 20 MG tablet Take 1 tablet (20 mg total) by mouth 2 (two) times daily before a meal. 60 tablet 3   Rimegepant Sulfate (NURTEC) 75 MG TBDP Take by mouth.     levothyroxine (SYNTHROID) 75 MCG tablet Take 75 mcg by mouth at bedtime. Brand name only     No current facility-administered medications for this visit.    Allergies as of 06/21/2023 - Review Complete 06/21/2023  Allergen Reaction Noted   Morphine and codeine  05/20/2016   Clindamycin/lincomycin Hives and Rash 05/20/2016   Synthroid [levothyroxine] Palpitations 05/20/2016    Family History  Problem Relation Age of Onset   Heart attack Mother    Stroke Mother    Heart attack Father    Prostate cancer Father    Heart attack Sister    Heart attack Brother 17   Heart attack Maternal Grandfather    Cancer Maternal Grandfather        throat/esophageal?   Cancer Paternal Grandfather        prostate   Heart attack Maternal Uncle    Cancer Paternal Uncle        patient states she doesn't really know but does not think it was colon cancer as previously listed.   Colon cancer Neg Hx     Social History   Socioeconomic History   Marital status: Married    Spouse name: Not on file   Number of children: Not on file   Years of education: Not on file   Highest education level: Not on file  Occupational History   Not on file  Tobacco Use   Smoking status: Every Day    Types: E-cigarettes    Last attempt to quit: 12/14/2014    Years since quitting: 8.5   Smokeless tobacco: Never   Tobacco comments:    vapes  Substance and Sexual Activity   Alcohol use: No   Drug use: No   Sexual activity: Not on file  Other Topics Concern   Not on file  Social History Narrative   Not on file   Social Determinants of Health   Financial Resource Strain: Low Risk  (03/29/2023)   Received from [facility]   Overall Financial Resource Strain (CARDIA)    Difficulty of Paying  Living Expenses: Not hard at all  Food Insecurity: No Food Insecurity (03/29/2023)   Received from [facility]   Hunger Vital Sign    Worried About Running Out of Food in the Last Year: Never true    Ran Out of Food in the Last Year: Never true  Transportation Needs: No Transportation Needs (03/29/2023)   Received from [facility] - Transportation    Lack of Transportation (Medical): No    Lack of Transportation (Non-Medical): No  Physical Activity: Inactive (04/28/2021)   Received from [facility], [facility]   Exercise Vital Sign    Days of Exercise per Week: 0 days    Minutes of  Exercise per Session: 0 min  Stress: No Stress Concern Present (04/28/2021)   Received from [facility], [facility]   [facility] of Occupational Health - Occupational Stress Questionnaire    Feeling of Stress : Not at all  Social Connections: Unknown (04/04/2022)   Received from [facility], [facility]   Social Network    Social Network: Not on file    Review of Systems: Gen: Denies fever, chills, cold or flulike symptoms, presyncope, syncope. CV: Denies chest pain, palpitations. Resp: Denies dyspnea, cough. GI: See HPI Heme: See HPI  Physical Exam: BP 112/75   Pulse 76   Temp 98.6 F (37 C)   Ht 5\' 3"  (1.6 m)   Wt 155 lb 12.8 oz (70.7 kg)   BMI 27.60 kg/m  General:   Alert and oriented. No distress noted. Pleasant and cooperative.  Head:  Normocephalic and atraumatic. Eyes:  Conjuctiva clear without scleral icterus. Heart:  S1, S2 present without murmurs appreciated. Lungs:  Clear to auscultation bilaterally. No wheezes, rales, or rhonchi. No distress.  Abdomen:  +BS, soft, non-tender and non-distended. No rebound or guarding. No HSM or masses noted. Msk:  Symmetrical without gross deformities. Normal posture. Extremities:  Without edema. Neurologic:  Alert and  oriented x4 Psych:  Normal mood and affect.    Assessment:  52 year old female with  history of GERD, gastritis, moderate-sized hiatal hernia, dysphagia, IBS with loose stools, presenting today for follow-up.  GERD: Historically on Dexilant, previously failing omeprazole and pantoprazole, but Dexilant was switched to Nexium for insurance purposes in 2022.  Reflux symptoms are fairly well-controlled, but she continues with breakthrough symptoms about twice a week.  She has never been on Aciphex and we will try her on this instead.  Advised that she can add Pepcid as needed for breakthrough.  Dysphagia: Reports dysphagia to foods and pills a few times a week with occasional regurgitation.  Prior EGD in 2018 with empiric dilation that was helpful.  Offered repeat EGD, but patient is not interested in further evaluation of dysphagia at this time.  IBS: Symptoms significantly improved with following a Mediterranean diet.  Colon cancer screening: Overdue for screening, but patient is requesting to hold off on this for now.  Currently without any significant lower GI symptoms/ alarm symptoms.   Plan:  Stop Nexium. Start Aciphex 20 mg twice daily. Reinforced GERD diet/lifestyle.  Separate written instructions provided on AVS. Advised that she may use Pepcid as needed if she continues to have occasional breakthrough heartburn despite the above recommendations.  Ultimately, she will let me know if Aciphex does not work as well as Nexium. Follow-up in 6 months or sooner if needed.  Patient will let me know if she would like to arrange upper endoscopy or colonoscopy.   Ermalinda Memos, PA-C [facility] 06/21/2023

## 2023-06-21 ENCOUNTER — Encounter: Payer: Self-pay | Admitting: Gastroenterology

## 2023-06-21 ENCOUNTER — Ambulatory Visit: Payer: 59 | Admitting: Gastroenterology

## 2023-06-21 VITALS — BP 112/75 | HR 76 | Temp 98.6°F | Ht 63.0 in | Wt 155.8 lb

## 2023-06-21 DIAGNOSIS — K580 Irritable bowel syndrome with diarrhea: Secondary | ICD-10-CM

## 2023-06-21 DIAGNOSIS — K219 Gastro-esophageal reflux disease without esophagitis: Secondary | ICD-10-CM

## 2023-06-21 DIAGNOSIS — R1310 Dysphagia, unspecified: Secondary | ICD-10-CM

## 2023-06-21 DIAGNOSIS — Z1211 Encounter for screening for malignant neoplasm of colon: Secondary | ICD-10-CM

## 2023-06-21 MED ORDER — RABEPRAZOLE SODIUM 20 MG PO TBEC: 20.0000 mg | DELAYED_RELEASE_TABLET | Freq: Two times a day (BID) | ORAL | 3 refills | Status: DC

## 2023-06-21 NOTE — Patient Instructions (Addendum)
Stop Nexium.   We will try you on Aciphex 20 mg twice a day for reflux.  Be sure to take the medication 30 minutes before breakfast and dinner.  Follow a GERD diet:  Avoid fried, fatty, greasy, spicy, citrus foods. Avoid caffeine and carbonated beverages. Avoid chocolate. Try eating 4-6 small meals a day rather than 3 large meals. Do not eat within 3 hours of laying down. Prop head of bed up on wood or bricks to create a 6 inch incline.  If you continued to have occasional breakthrough heartburn symptoms, you can take Pepcid (famotidine) 20 mg as needed.  Ultimately, if you feel that Aciphex does not work as well as Nexium, please let me know and we can go back to Nexium.  We will follow-up with you in the office in 6 months or sooner if needed.  Let me know if you would like to arrange an upper endoscopy or colonoscopy prior to your follow-up.  It was great to see you again today! I want to create trusting relationships with patients. If you receive a survey regarding your visit,  I greatly appreciate you taking time to fill this out on paper or through your MyChart. I value your feedback.  Ermalinda Memos, PA-C [facility]

## 2023-06-23 ENCOUNTER — Encounter: Payer: Self-pay | Admitting: Gastroenterology

## 2023-07-25 ENCOUNTER — Encounter: Payer: Self-pay | Admitting: *Deleted

## 2023-07-26 ENCOUNTER — Other Ambulatory Visit: Payer: Self-pay | Admitting: Gastroenterology

## 2023-07-26 ENCOUNTER — Telehealth: Payer: Self-pay | Admitting: *Deleted

## 2023-07-26 DIAGNOSIS — K219 Gastro-esophageal reflux disease without esophagitis: Secondary | ICD-10-CM

## 2023-07-26 MED ORDER — ESOMEPRAZOLE MAGNESIUM 40 MG PO CPDR: 40.0000 mg | DELAYED_RELEASE_CAPSULE | Freq: Two times a day (BID) | ORAL | 3 refills | Status: DC

## 2023-07-26 NOTE — Telephone Encounter (Signed)
Yes, we can switch back to Nexium 40 mg twice daily.  I have sent a prescription to the pharmacy and sent patient a message back through MyChart.

## 2023-07-26 NOTE — Telephone Encounter (Signed)
Pt called and would like to go back to Nexium. States she is having heartburn. Also, sent a MyChart message.

## 2023-07-26 NOTE — Telephone Encounter (Signed)
Noted  

## 2023-08-01 ENCOUNTER — Encounter: Payer: Self-pay | Admitting: *Deleted

## 2023-11-02 ENCOUNTER — Encounter: Payer: Self-pay | Admitting: Gastroenterology

## 2023-12-20 NOTE — Progress Notes (Unsigned)
Referring Provider: Remus Loffler, PA-C Primary Care Physician:  Remus Loffler, PA-C Primary GI Physician: Dr. Marletta Lor  No chief complaint on file.   HPI:   Brooke Parsons is a 53 y.o. female with history of GERD, gastritis, moderate sized hiatal hernia, dysphagia, and IBS with loose stools.   Last EGD August 2018 with mild gastritis due to ibuprofen, moderate size hiatal hernia.   Normal BPE in Dec 2018.   Historically on Dexilant, failing omeprazole and pantoprazole, but Dexilant switched to Nexium for insurance purposes in 2022.   For persistent globus sensation she was referred to [facility] ENT in 2019. She had a transnasal flexible laryngoscopy showing laryngeal edema felt to be secondary to acid reflux.    Last seen in the office 06/21/2023.  Reported heartburn a couple times a week taking Nexium twice daily.  Dysphagia to foods and pills a couple times a week.  Occasional regurgitation.  Was not interested in further workup though did report last dilation was helpful.  Reported she had been following a Mediterranean diet for the last several months and had significant improvement in her IBS symptoms.  She was now having 1 bowel movement daily.  Requested to hold off on screening colonoscopy.  Recommended she stop Nexium, start Aciphex 20 mg twice daily, Pepcid, follow-up in 6 months.  Patient called 07/25/23 requesting to go back to Nexium.  Today:    Past Medical History:  Diagnosis Date   Cervical cancer (HCC)    GERD (gastroesophageal reflux disease)    Hyperlipidemia    Irritable bowel syndrome (IBS)    Thyroid disease     Past Surgical History:  Procedure Laterality Date   ABDOMINAL HYSTERECTOMY     CARPAL TUNNEL RELEASE     ESOPHAGOGASTRODUODENOSCOPY  02/2012   Dr. Teena Dunk: schatzki's ring, hh, duodenal bx negative for celiac.    ESOPHAGOGASTRODUODENOSCOPY N/A 06/22/2017   Dr. Darrick Penna: mild gastritis due to Ibuprofen, moderate size hiatal hernia (path with  reactive gastropathy/chemical gastritis) Negative H.pylori. Esophageal biopsies with reflux changes.   SAVORY DILATION N/A 06/22/2017   Procedure: SAVORY DILATION;  Surgeon: West Bali, MD;  [facility];  Service: Endoscopy;  Laterality: N/A;   TONSILLECTOMY      Current Outpatient Medications  Medication Sig Dispense Refill   acetaminophen (TYLENOL) 500 MG tablet Take 500 mg by mouth daily as needed for moderate pain or headache.     esomeprazole (NEXIUM) 40 MG capsule Take 1 capsule (40 mg total) by mouth 2 (two) times daily before a meal. 60 capsule 3   levothyroxine (SYNTHROID) 75 MCG tablet Take 75 mcg by mouth at bedtime. Brand name only     Rimegepant Sulfate (NURTEC) 75 MG TBDP Take by mouth.     No current facility-administered medications for this visit.    Allergies as of 12/21/2023 - Review Complete 06/21/2023  Allergen Reaction Noted   Morphine and codeine  05/20/2016   Clindamycin/lincomycin Hives and Rash 05/20/2016   Synthroid [levothyroxine] Palpitations 05/20/2016    Family History  Problem Relation Age of Onset   Heart attack Mother    Stroke Mother    Heart attack Father    Prostate cancer Father    Heart attack Sister    Heart attack Brother 30   Heart attack Maternal Grandfather    Cancer Maternal Grandfather        throat/esophageal?   Cancer Paternal Grandfather        prostate  Heart attack Maternal Uncle    Cancer Paternal Uncle        patient states she doesn't really know but does not think it was colon cancer as previously listed.   Colon cancer Neg Hx     Social History   Socioeconomic History   Marital status: Married    Spouse name: Not on file   Number of children: Not on file   Years of education: Not on file   Highest education level: Not on file  Occupational History   Not on file  Tobacco Use   Smoking status: Every Day    Types: E-cigarettes    Last attempt to quit: 12/14/2014    Years since quitting: 9.0    Smokeless tobacco: Never   Tobacco comments:    vapes  Substance and Sexual Activity   Alcohol use: No   Drug use: No   Sexual activity: Not on file  Other Topics Concern   Not on file  Social History Narrative   Not on file   Social Drivers of Health   Financial Resource Strain: Low Risk  (03/29/2023)   Received from [facility]   Overall Financial Resource Strain (CARDIA)    Difficulty of Paying Living Expenses: Not hard at all  Food Insecurity: No Food Insecurity (03/29/2023)   Received from [facility]   Hunger Vital Sign    Worried About Running Out of Food in the Last Year: Never true    Ran Out of Food in the Last Year: Never true  Transportation Needs: No Transportation Needs (03/29/2023)   Received from [facility] - Transportation    Lack of Transportation (Medical): No    Lack of Transportation (Non-Medical): No  Physical Activity: Inactive (04/28/2021)   Received from [facility], [facility]   Exercise Vital Sign    Days of Exercise per Week: 0 days    Minutes of Exercise per Session: 0 min  Stress: No Stress Concern Present (04/28/2021)   Received from [facility], [facility] of Occupational Health - Occupational Stress Questionnaire    Feeling of Stress : Not at all  Social Connections: Unknown (04/04/2022)   Received from [facility], [facility]   Social Network    Social Network: Not on file    Review of Systems: Gen: Denies fever, chills, anorexia. Denies fatigue, weakness, weight loss.  CV: Denies chest pain, palpitations, syncope, peripheral edema, and claudication. Resp: Denies dyspnea at rest, cough, wheezing, coughing up blood, and pleurisy. GI: Denies vomiting blood, jaundice, and fecal incontinence.   Denies dysphagia or odynophagia. Derm: Denies rash, itching, dry skin Psych: Denies depression, anxiety, memory loss, confusion. No homicidal or suicidal ideation.  Heme: Denies bruising,  bleeding, and enlarged lymph nodes.  Physical Exam: There were no vitals taken for this visit. General:   Alert and oriented. No distress noted. Pleasant and cooperative.  Head:  Normocephalic and atraumatic. Eyes:  Conjuctiva clear without scleral icterus. Heart:  S1, S2 present without murmurs appreciated. Lungs:  Clear to auscultation bilaterally. No wheezes, rales, or rhonchi. No distress.  Abdomen:  +BS, soft, non-tender and non-distended. No rebound or guarding. No HSM or masses noted. Msk:  Symmetrical without gross deformities. Normal posture. Extremities:  Without edema. Neurologic:  Alert and  oriented x4 Psych:  Normal mood and affect.    Assessment:     Plan:  ***   Ermalinda Memos, PA-C [facility] 12/21/2023

## 2023-12-21 ENCOUNTER — Ambulatory Visit: Payer: 59 | Admitting: Gastroenterology

## 2023-12-21 ENCOUNTER — Encounter: Payer: Self-pay | Admitting: Gastroenterology

## 2023-12-21 VITALS — BP 107/70 | HR 79 | Temp 97.6°F | Ht 63.0 in | Wt 141.0 lb

## 2023-12-21 DIAGNOSIS — K219 Gastro-esophageal reflux disease without esophagitis: Secondary | ICD-10-CM | POA: Diagnosis not present

## 2023-12-21 DIAGNOSIS — R1310 Dysphagia, unspecified: Secondary | ICD-10-CM | POA: Diagnosis not present

## 2023-12-21 DIAGNOSIS — Z1211 Encounter for screening for malignant neoplasm of colon: Secondary | ICD-10-CM

## 2023-12-21 MED ORDER — SUCRALFATE 1 G PO TABS: ORAL_TABLET | ORAL | 1 refills | Status: AC

## 2023-12-21 MED ORDER — ESOMEPRAZOLE MAGNESIUM 40 MG PO CPDR: 40.0000 mg | DELAYED_RELEASE_CAPSULE | Freq: Two times a day (BID) | ORAL | 3 refills | Status: AC

## 2023-12-21 NOTE — Patient Instructions (Addendum)
Continue Nexium 40 mg twice daily 30 minutes before breakfast and dinner.  Start Carafate 1 g every 8 hours as needed for breakthrough heartburn.  Dissolve 1 tablet in 15-30 mL of water, then drink the slurry.  Follow a GERD diet:  Avoid fried, fatty, greasy, spicy, citrus foods. Avoid caffeine and carbonated beverages. Avoid chocolate. Try eating 4-6 small meals a day rather than 3 large meals. Do not eat within 3 hours of laying down. Prop head of bed up on wood or bricks to create a 6 inch incline.   I will plan to see you back in 6 months or sooner if needed.  Ermalinda Memos, PA-C [facility]

## 2023-12-25 ENCOUNTER — Ambulatory Visit: Payer: 59 | Admitting: Gastroenterology

## 2024-05-09 ENCOUNTER — Encounter: Payer: Self-pay | Admitting: Gastroenterology
